# Patient Record
Sex: Female | Born: 1940 | Race: White | Hispanic: No | Marital: Married | Smoking: Former smoker
Health system: Southern US, Community
[De-identification: ages and names within clinical notes are randomized; demographics above are authoritative.]

## PROBLEM LIST (undated history)

## (undated) DIAGNOSIS — D126 Benign neoplasm of colon, unspecified: Secondary | ICD-10-CM

## (undated) DIAGNOSIS — F502 Bulimia nervosa, unspecified: Secondary | ICD-10-CM

## (undated) DIAGNOSIS — K5790 Diverticulosis of intestine, part unspecified, without perforation or abscess without bleeding: Secondary | ICD-10-CM

## (undated) DIAGNOSIS — G459 Transient cerebral ischemic attack, unspecified: Secondary | ICD-10-CM

## (undated) DIAGNOSIS — I639 Cerebral infarction, unspecified: Secondary | ICD-10-CM

## (undated) DIAGNOSIS — E785 Hyperlipidemia, unspecified: Secondary | ICD-10-CM

## (undated) DIAGNOSIS — K648 Other hemorrhoids: Secondary | ICD-10-CM

## (undated) DIAGNOSIS — I10 Essential (primary) hypertension: Secondary | ICD-10-CM

## (undated) DIAGNOSIS — B029 Zoster without complications: Secondary | ICD-10-CM

## (undated) DIAGNOSIS — G610 Guillain-Barre syndrome: Secondary | ICD-10-CM

## (undated) DIAGNOSIS — F1910 Other psychoactive substance abuse, uncomplicated: Secondary | ICD-10-CM

## (undated) HISTORY — PX: TONSILLECTOMY: SUR1361

## (undated) HISTORY — DX: Guillain-Barre syndrome: G61.0

## (undated) HISTORY — DX: Essential (primary) hypertension: I10

## (undated) HISTORY — DX: Diverticulosis of intestine, part unspecified, without perforation or abscess without bleeding: K57.90

## (undated) HISTORY — DX: Zoster without complications: B02.9

## (undated) HISTORY — DX: Transient cerebral ischemic attack, unspecified: G45.9

## (undated) HISTORY — DX: Hyperlipidemia, unspecified: E78.5

## (undated) HISTORY — PX: COLONOSCOPY: SHX174

## (undated) HISTORY — DX: Other hemorrhoids: K64.8

## (undated) HISTORY — DX: Bulimia nervosa: F50.2

## (undated) HISTORY — DX: Other psychoactive substance abuse, uncomplicated: F19.10

## (undated) HISTORY — DX: Benign neoplasm of colon, unspecified: D12.6

## (undated) HISTORY — DX: Bulimia nervosa, unspecified: F50.20

---

## 1964-07-27 HISTORY — PX: APPENDECTOMY: SHX54

## 1968-07-27 DIAGNOSIS — G610 Guillain-Barre syndrome: Secondary | ICD-10-CM

## 1968-07-27 HISTORY — DX: Guillain-Barre syndrome: G61.0

## 1979-07-28 HISTORY — PX: TUBAL LIGATION: SHX77

## 2001-05-05 ENCOUNTER — Other Ambulatory Visit: Admission: RE | Admit: 2001-05-05 | Discharge: 2001-05-05 | Payer: Self-pay | Admitting: Family Medicine

## 2008-11-07 ENCOUNTER — Ambulatory Visit: Payer: Self-pay | Admitting: Internal Medicine

## 2008-11-13 ENCOUNTER — Encounter: Payer: Self-pay | Admitting: Internal Medicine

## 2008-11-13 ENCOUNTER — Ambulatory Visit: Payer: Self-pay | Admitting: Internal Medicine

## 2008-11-20 ENCOUNTER — Encounter: Payer: Self-pay | Admitting: Internal Medicine

## 2008-11-29 ENCOUNTER — Encounter: Admission: RE | Admit: 2008-11-29 | Discharge: 2008-11-29 | Payer: Self-pay | Admitting: Internal Medicine

## 2011-08-19 ENCOUNTER — Other Ambulatory Visit: Payer: Self-pay | Admitting: Internal Medicine

## 2011-08-19 DIAGNOSIS — Z1231 Encounter for screening mammogram for malignant neoplasm of breast: Secondary | ICD-10-CM

## 2011-09-07 ENCOUNTER — Ambulatory Visit
Admission: RE | Admit: 2011-09-07 | Discharge: 2011-09-07 | Disposition: A | Discharge status: Home / Self Care | Payer: Private Health Insurance - Indemnity | Source: Ambulatory Visit | Attending: Internal Medicine | Admitting: Internal Medicine

## 2011-09-07 DIAGNOSIS — Z1231 Encounter for screening mammogram for malignant neoplasm of breast: Secondary | ICD-10-CM

## 2011-10-07 ENCOUNTER — Encounter: Payer: Self-pay | Admitting: Internal Medicine

## 2012-08-09 ENCOUNTER — Other Ambulatory Visit: Payer: Self-pay | Admitting: Internal Medicine

## 2012-08-09 DIAGNOSIS — Z1231 Encounter for screening mammogram for malignant neoplasm of breast: Secondary | ICD-10-CM

## 2012-09-01 ENCOUNTER — Encounter: Payer: Self-pay | Admitting: Internal Medicine

## 2012-09-07 ENCOUNTER — Ambulatory Visit: Payer: Private Health Insurance - Indemnity

## 2012-09-27 ENCOUNTER — Ambulatory Visit
Admission: RE | Admit: 2012-09-27 | Discharge: 2012-09-27 | Disposition: A | Discharge status: Home / Self Care | Payer: Managed Care, Other (non HMO) | Source: Ambulatory Visit | Attending: Internal Medicine | Admitting: Internal Medicine

## 2012-09-27 DIAGNOSIS — Z1231 Encounter for screening mammogram for malignant neoplasm of breast: Secondary | ICD-10-CM

## 2014-12-10 ENCOUNTER — Other Ambulatory Visit: Payer: Self-pay

## 2014-12-10 DIAGNOSIS — Z1231 Encounter for screening mammogram for malignant neoplasm of breast: Secondary | ICD-10-CM

## 2014-12-25 ENCOUNTER — Telehealth: Payer: Self-pay | Admitting: Internal Medicine

## 2014-12-25 NOTE — Telephone Encounter (Signed)
Left message for patient to call back - due for Recall COLON with Dr. Carlean Purl

## 2015-01-09 ENCOUNTER — Ambulatory Visit
Admission: RE | Admit: 2015-01-09 | Discharge: 2015-01-09 | Disposition: A | Discharge status: Home / Self Care | Payer: BLUE CROSS/BLUE SHIELD | Source: Ambulatory Visit

## 2015-01-09 DIAGNOSIS — Z1231 Encounter for screening mammogram for malignant neoplasm of breast: Secondary | ICD-10-CM

## 2016-04-26 DIAGNOSIS — G459 Transient cerebral ischemic attack, unspecified: Secondary | ICD-10-CM

## 2016-04-26 HISTORY — DX: Transient cerebral ischemic attack, unspecified: G45.9

## 2016-05-11 ENCOUNTER — Observation Stay (HOSPITAL_COMMUNITY): Payer: BLUE CROSS/BLUE SHIELD

## 2016-05-11 ENCOUNTER — Observation Stay (HOSPITAL_COMMUNITY)
Admission: EM | Admit: 2016-05-11 | Discharge: 2016-05-12 | Disposition: A | Discharge status: Home / Self Care | Payer: BLUE CROSS/BLUE SHIELD | Attending: Internal Medicine | Admitting: Internal Medicine

## 2016-05-11 ENCOUNTER — Emergency Department (HOSPITAL_COMMUNITY): Payer: BLUE CROSS/BLUE SHIELD

## 2016-05-11 ENCOUNTER — Encounter (HOSPITAL_COMMUNITY): Payer: Self-pay

## 2016-05-11 DIAGNOSIS — Z7982 Long term (current) use of aspirin: Secondary | ICD-10-CM | POA: Diagnosis not present

## 2016-05-11 DIAGNOSIS — I080 Rheumatic disorders of both mitral and aortic valves: Secondary | ICD-10-CM | POA: Insufficient documentation

## 2016-05-11 DIAGNOSIS — R471 Dysarthria and anarthria: Secondary | ICD-10-CM

## 2016-05-11 DIAGNOSIS — E785 Hyperlipidemia, unspecified: Secondary | ICD-10-CM | POA: Diagnosis not present

## 2016-05-11 DIAGNOSIS — Z87891 Personal history of nicotine dependence: Secondary | ICD-10-CM | POA: Diagnosis not present

## 2016-05-11 DIAGNOSIS — G459 Transient cerebral ischemic attack, unspecified: Principal | ICD-10-CM | POA: Diagnosis present

## 2016-05-11 DIAGNOSIS — Z882 Allergy status to sulfonamides status: Secondary | ICD-10-CM | POA: Diagnosis not present

## 2016-05-11 DIAGNOSIS — R29810 Facial weakness: Secondary | ICD-10-CM

## 2016-05-11 DIAGNOSIS — G451 Carotid artery syndrome (hemispheric): Secondary | ICD-10-CM | POA: Diagnosis not present

## 2016-05-11 LAB — CBC
HEMATOCRIT: 41.4 % (ref 36.0–46.0)
Hemoglobin: 13.8 g/dL (ref 12.0–15.0)
MCH: 29.5 pg (ref 26.0–34.0)
MCHC: 33.3 g/dL (ref 30.0–36.0)
MCV: 88.5 fL (ref 78.0–100.0)
Platelets: 234 10*3/uL (ref 150–400)
RBC: 4.68 MIL/uL (ref 3.87–5.11)
RDW: 14 % (ref 11.5–15.5)
WBC: 9 10*3/uL (ref 4.0–10.5)

## 2016-05-11 LAB — DIFFERENTIAL
BASOS ABS: 0 10*3/uL (ref 0.0–0.1)
BASOS PCT: 0 %
Eosinophils Absolute: 0 10*3/uL (ref 0.0–0.7)
Eosinophils Relative: 0 %
LYMPHS PCT: 14 %
Lymphs Abs: 1.3 10*3/uL (ref 0.7–4.0)
MONOS PCT: 3 %
Monocytes Absolute: 0.3 10*3/uL (ref 0.1–1.0)
NEUTROS ABS: 7.4 10*3/uL (ref 1.7–7.7)
Neutrophils Relative %: 83 %

## 2016-05-11 LAB — COMPREHENSIVE METABOLIC PANEL
ALK PHOS: 53 U/L (ref 38–126)
ALT: 28 U/L (ref 14–54)
AST: 27 U/L (ref 15–41)
Albumin: 4.1 g/dL (ref 3.5–5.0)
Anion gap: 8 (ref 5–15)
BUN: 16 mg/dL (ref 6–20)
CHLORIDE: 109 mmol/L (ref 101–111)
CO2: 25 mmol/L (ref 22–32)
CREATININE: 0.87 mg/dL (ref 0.44–1.00)
Calcium: 10 mg/dL (ref 8.9–10.3)
GFR calc Af Amer: >60 mL/min (ref >60)
GFR calc non Af Amer: >60 mL/min (ref >60)
Glucose, Bld: 102 mg/dL — ABNORMAL HIGH (ref 65–99)
Potassium: 4.2 mmol/L (ref 3.5–5.1)
SODIUM: 142 mmol/L (ref 135–145)
Total Bilirubin: 0.7 mg/dL (ref 0.3–1.2)
Total Protein: 7 g/dL (ref 6.5–8.1)

## 2016-05-11 LAB — URINE MICROSCOPIC-ADD ON
Bacteria, UA: NONE SEEN
RBC / HPF: NONE SEEN RBC/hpf (ref 0–5)
WBC, UA: NONE SEEN WBC/hpf (ref 0–5)

## 2016-05-11 LAB — APTT: APTT: 30 s (ref 24–36)

## 2016-05-11 LAB — RAPID URINE DRUG SCREEN, HOSP PERFORMED
AMPHETAMINES: NOT DETECTED
BARBITURATES: NOT DETECTED
BENZODIAZEPINES: NOT DETECTED
Cocaine: NOT DETECTED
Opiates: NOT DETECTED
TETRAHYDROCANNABINOL: NOT DETECTED

## 2016-05-11 LAB — I-STAT CHEM 8, ED
BUN: 19 mg/dL (ref 6–20)
CHLORIDE: 107 mmol/L (ref 101–111)
CREATININE: 0.8 mg/dL (ref 0.44–1.00)
Calcium, Ion: 1.2 mmol/L (ref 1.15–1.40)
GLUCOSE: 101 mg/dL — AB (ref 65–99)
HEMATOCRIT: 43 % (ref 36.0–46.0)
HEMOGLOBIN: 14.6 g/dL (ref 12.0–15.0)
POTASSIUM: 4.2 mmol/L (ref 3.5–5.1)
Sodium: 143 mmol/L (ref 135–145)
TCO2: 25 mmol/L (ref 0–100)

## 2016-05-11 LAB — URINALYSIS, ROUTINE W REFLEX MICROSCOPIC
BILIRUBIN URINE: NEGATIVE
Glucose, UA: NEGATIVE mg/dL
KETONES UR: NEGATIVE mg/dL
Nitrite: NEGATIVE
PROTEIN: NEGATIVE mg/dL
Specific Gravity, Urine: <1.005 — ABNORMAL LOW (ref 1.005–1.030)
pH: 7 (ref 5.0–8.0)

## 2016-05-11 LAB — I-STAT TROPONIN, ED: Troponin i, poc: 0.01 ng/mL (ref 0.00–0.08)

## 2016-05-11 LAB — PROTIME-INR
INR: 0.87
Prothrombin Time: 11.8 seconds (ref 11.4–15.2)

## 2016-05-11 MED ORDER — STROKE: EARLY STAGES OF RECOVERY BOOK
Freq: Once | Status: DC
  Filled 2016-05-11: qty 1

## 2016-05-11 MED ORDER — SODIUM CHLORIDE 0.9 % IV SOLN
INTRAVENOUS | Status: DC
  Administered 2016-05-11 – 2016-05-12 (×2): via INTRAVENOUS

## 2016-05-11 MED ORDER — ASPIRIN 300 MG RE SUPP: 300.0000 mg | Freq: Every day | RECTAL | Status: DC

## 2016-05-11 MED ORDER — LORAZEPAM 1 MG PO TABS
1.0000 mg | ORAL_TABLET | Freq: Once | ORAL | Status: AC
  Administered 2016-05-11: 1 mg via ORAL
  Filled 2016-05-11: qty 1

## 2016-05-11 MED ORDER — ENOXAPARIN SODIUM 40 MG/0.4ML ~~LOC~~ SOLN
40.0000 mg | SUBCUTANEOUS | Status: DC
  Administered 2016-05-11: 40 mg via SUBCUTANEOUS
  Filled 2016-05-11: qty 0.4

## 2016-05-11 MED ORDER — ASPIRIN 325 MG PO TABS
325.0000 mg | ORAL_TABLET | Freq: Every day | ORAL | Status: DC
  Administered 2016-05-11 – 2016-05-12 (×2): 325 mg via ORAL
  Filled 2016-05-11 (×2): qty 1

## 2016-05-11 NOTE — ED Notes (Signed)
Pt presented with left sided facial droop and aphasia. She is currently back to neuro baseline with a NIH of 0 and  passed swallow study, she is ambulatory, next q2h neuro at 0100. Husband at bedside.

## 2016-05-11 NOTE — ED Notes (Signed)
Hospitalist at bedside at this time 

## 2016-05-11 NOTE — Consult Note (Signed)
Admission H&P    Chief Complaint: New onset slurred speech and facial droop as well as dysphagia.  HPI: Sheila Casey is an 75 y.o. female with no documented medical disorder, presenting with acute onset of left facial droop, slurred speech and difficulty swallowing. Patient woke up with deficits this morning. She was last known well at bedtime last night which was 11:00 PM. She has no previous history of stroke nor TIA. CT scan of the head as well as MRI of the brain showed no acute intracranial abnormality. Patient's symptoms have improved, including since arriving in the ED. She is still having some difficulty with swallowing and noticeable facial droop. NIH stroke score was 4.  LSN: 11:00 PM on 05/10/2016 tPA Given: No: Beyond time under for treatment consideration on arrival in the ED mRankin:  History reviewed. No pertinent past medical history.  Past Surgical History:  Procedure Laterality Date  . APPENDECTOMY      Family History  Problem Relation Age of Onset  . Stroke Neg Hx    Social History:  reports that she has quit smoking. She has never used smokeless tobacco. She reports that she does not drink alcohol or use drugs.  Allergies:  Allergies  Allergen Reactions  . Sulfonamide Derivatives     REACTION: Hyper active    Medications: Preadmission medications were reviewed by me.  ROS: History obtained from the patient  General ROS: negative for - chills, fatigue, fever, night sweats, weight gain or weight loss Psychological ROS: negative for - behavioral disorder, hallucinations, memory difficulties, mood swings or suicidal ideation Ophthalmic ROS: negative for - blurry vision, double vision, eye pain or loss of vision ENT ROS: negative for - epistaxis, nasal discharge, oral lesions, sore throat, tinnitus or vertigo Allergy and Immunology ROS: negative for - hives or itchy/watery eyes Hematological and Lymphatic ROS: negative for - bleeding problems, bruising or  swollen lymph nodes Endocrine ROS: negative for - galactorrhea, hair pattern changes, polydipsia/polyuria or temperature intolerance Respiratory ROS: negative for - cough, hemoptysis, shortness of breath or wheezing Cardiovascular ROS: negative for - chest pain, dyspnea on exertion, edema or irregular heartbeat Gastrointestinal ROS: negative for - abdominal pain, diarrhea, hematemesis, nausea/vomiting or stool incontinence Genito-Urinary ROS: negative for - dysuria, hematuria, incontinence or urinary frequency/urgency Musculoskeletal ROS: negative for - joint swelling or muscular weakness Neurological ROS: as noted in HPI Dermatological ROS: negative for rash and skin lesion changes  Physical Examination: Blood pressure 149/88, pulse 91, temperature 99 F (37.2 C), temperature source Oral, resp. rate 21, height 5' 3"  (1.6 m), weight 63.5 kg (140 lb), SpO2 95 %.  HEENT-  Normocephalic, no lesions, without obvious abnormality.  Normal external eye and conjunctiva.  Normal TM's bilaterally.  Normal auditory canals and external ears. Normal external nose, mucus membranes and septum.  Normal pharynx. Neck supple with no masses, nodes, nodules or enlargement. Cardiovascular - regular rate and rhythm, S1, S2 normal, no murmur, click, rub or gallop Lungs - chest clear, no wheezing, rales, normal symmetric air entry Abdomen - soft, non-tender; bowel sounds normal; no masses,  no organomegaly Extremities - no joint deformities, effusion, or inflammation and no edema  Neurologic Examination: Mental Status: Alert, oriented, thought content appropriate.  Speech was slightly slurred, without evidence of aphasia. Able to follow commands without difficulty. Cranial Nerves: II-Visual fields were normal. III/IV/VI-Pupils were equal and reacted normally to light. Extraocular movements were full and conjugate.    V/VII-mild left lower facial numbness; moderate left lower facial weakness.  VIII-normal. X-mild  dysarthria; symmetrical palatal movement. XI: trapezius strength/neck flexion strength normal bilaterally XII-midline tongue extension with normal strength. Motor: 5/5 bilaterally with normal tone and bulk Sensory: Normal throughout. Deep Tendon Reflexes: 1+ and symmetric. Plantars: Mute bilaterally Cerebellar: Normal finger-to-nose testing. Carotid auscultation: Normal  Results for orders placed or performed during the hospital encounter of 05/11/16 (from the past 48 hour(s))  Urine rapid drug screen (hosp performed)not at [facility]     Status: None   Collection Time: 05/11/16  9:44 AM  Result Value Ref Range   Opiates NONE DETECTED NONE DETECTED   Cocaine NONE DETECTED NONE DETECTED   Benzodiazepines NONE DETECTED NONE DETECTED   Amphetamines NONE DETECTED NONE DETECTED   Tetrahydrocannabinol NONE DETECTED NONE DETECTED   Barbiturates NONE DETECTED NONE DETECTED    Comment:        DRUG SCREEN FOR MEDICAL PURPOSES ONLY.  IF CONFIRMATION IS NEEDED FOR ANY PURPOSE, NOTIFY LAB WITHIN 5 DAYS.        LOWEST DETECTABLE LIMITS FOR URINE DRUG SCREEN Drug Class       Cutoff (ng/mL) Amphetamine      1000 Barbiturate      200 Benzodiazepine   371 Tricyclics       062 Opiates          300 Cocaine          300 THC              50   Urinalysis, Routine w reflex microscopic (not at [facility])     Status: Abnormal   Collection Time: 05/11/16  9:44 AM  Result Value Ref Range   Color, Urine YELLOW YELLOW   APPearance CLEAR CLEAR   Specific Gravity, Urine <1.005 (L) 1.005 - 1.030   pH 7.0 5.0 - 8.0   Glucose, UA NEGATIVE NEGATIVE mg/dL   Hgb urine dipstick TRACE (A) NEGATIVE   Bilirubin Urine NEGATIVE NEGATIVE   Ketones, ur NEGATIVE NEGATIVE mg/dL   Protein, ur NEGATIVE NEGATIVE mg/dL   Nitrite NEGATIVE NEGATIVE   Leukocytes, UA SMALL (A) NEGATIVE  Urine microscopic-add on     Status: Abnormal   Collection Time: 05/11/16  9:44 AM  Result Value Ref Range   Squamous Epithelial / LPF 0-5 (A)  NONE SEEN   WBC, UA NONE SEEN 0 - 5 WBC/hpf   RBC / HPF NONE SEEN 0 - 5 RBC/hpf   Bacteria, UA NONE SEEN NONE SEEN  Protime-INR     Status: None   Collection Time: 05/11/16  9:55 AM  Result Value Ref Range   Prothrombin Time 11.8 11.4 - 15.2 seconds   INR 0.87   APTT     Status: None   Collection Time: 05/11/16  9:55 AM  Result Value Ref Range   aPTT 30 24 - 36 seconds  CBC     Status: None   Collection Time: 05/11/16  9:55 AM  Result Value Ref Range   WBC 9.0 4.0 - 10.5 K/uL   RBC 4.68 3.87 - 5.11 MIL/uL   Hemoglobin 13.8 12.0 - 15.0 g/dL   HCT 41.4 36.0 - 46.0 %   MCV 88.5 78.0 - 100.0 fL   MCH 29.5 26.0 - 34.0 pg   MCHC 33.3 30.0 - 36.0 g/dL   RDW 14.0 11.5 - 15.5 %   Platelets 234 150 - 400 K/uL  Differential     Status: None   Collection Time: 05/11/16  9:55 AM  Result Value Ref Range   Neutrophils  Relative % 83 %   Neutro Abs 7.4 1.7 - 7.7 K/uL   Lymphocytes Relative 14 %   Lymphs Abs 1.3 0.7 - 4.0 K/uL   Monocytes Relative 3 %   Monocytes Absolute 0.3 0.1 - 1.0 K/uL   Eosinophils Relative 0 %   Eosinophils Absolute 0.0 0.0 - 0.7 K/uL   Basophils Relative 0 %   Basophils Absolute 0.0 0.0 - 0.1 K/uL  Comprehensive metabolic panel     Status: Abnormal   Collection Time: 05/11/16  9:55 AM  Result Value Ref Range   Sodium 142 135 - 145 mmol/L   Potassium 4.2 3.5 - 5.1 mmol/L   Chloride 109 101 - 111 mmol/L   CO2 25 22 - 32 mmol/L   Glucose, Bld 102 (H) 65 - 99 mg/dL   BUN 16 6 - 20 mg/dL   Creatinine, Ser 0.87 0.44 - 1.00 mg/dL   Calcium 10.0 8.9 - 10.3 mg/dL   Total Protein 7.0 6.5 - 8.1 g/dL   Albumin 4.1 3.5 - 5.0 g/dL   AST 27 15 - 41 U/L   ALT 28 14 - 54 U/L   Alkaline Phosphatase 53 38 - 126 U/L   Total Bilirubin 0.7 0.3 - 1.2 mg/dL   GFR calc non Af Amer >60 >60 mL/min   GFR calc Af Amer >60 >60 mL/min    Comment: (NOTE) The eGFR has been calculated using the CKD EPI equation. This calculation has not been validated in all clinical  situations. eGFR's persistently <60 mL/min signify possible Chronic Kidney Disease.    Anion gap 8 5 - 15  I-stat troponin, ED (not at [facility], [facility])     Status: None   Collection Time: 05/11/16 10:09 AM  Result Value Ref Range   Troponin i, poc 0.01 0.00 - 0.08 ng/mL   Comment 3            Comment: Due to the release kinetics of cTnI, a negative result within the first hours of the onset of symptoms does not rule out myocardial infarction with certainty. If myocardial infarction is still suspected, repeat the test at appropriate intervals.   I-Stat Chem 8, ED  (not at [facility], [facility])     Status: Abnormal   Collection Time: 05/11/16 10:11 AM  Result Value Ref Range   Sodium 143 135 - 145 mmol/L   Potassium 4.2 3.5 - 5.1 mmol/L   Chloride 107 101 - 111 mmol/L   BUN 19 6 - 20 mg/dL   Creatinine, Ser 0.80 0.44 - 1.00 mg/dL   Glucose, Bld 101 (H) 65 - 99 mg/dL   Calcium, Ion 1.20 1.15 - 1.40 mmol/L   TCO2 25 0 - 100 mmol/L   Hemoglobin 14.6 12.0 - 15.0 g/dL   HCT 43.0 36.0 - 46.0 %   Ct Head Wo Contrast  Result Date: 05/11/2016 CLINICAL DATA:  Difficulty swallowing this morning, unable to speak. Speech is improved but is still slurred. Left facial numbness. EXAM: CT HEAD WITHOUT CONTRAST TECHNIQUE: Contiguous axial images were obtained from the base of the skull through the vertex without intravenous contrast. COMPARISON:  None. FINDINGS: Brain: Linear low density along the left upper cerebellum potentially from accentuated sulcus or remote small infarct. Posterior fossa structures otherwise unremarkable. The brainstem, cerebral peduncles, thalami, and basal ganglia appear unremarkable aside from symmetric calcifications in the globus pallidus nuclei which are likely physiologic. 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 No intracranial hemorrhage, mass lesion, or acute CVA.  Vascular: There is atherosclerotic calcification of the cavernous carotid arteries bilaterally. Skull:  Unremarkable Sinuses/Orbits: Unremarkable Other: No supplemental non-categorized findings. IMPRESSION: 1. No acute intracranial findings to correspond to the patient's recent symptoms. 2. 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 3. Prominent sulcus versus a linear remote infarct of the left upper cerebellum, image 11/2. Electronically Signed   By: Van Clines M.D.   On: 05/11/2016 11:37   Mr Brain Wo Contrast  Result Date: 05/11/2016 CLINICAL DATA:  Left facial droop and difficulty swallowing EXAM: MRI HEAD WITHOUT CONTRAST TECHNIQUE: Multiplanar, multiecho pulse sequences of the brain and surrounding structures were obtained without intravenous contrast. COMPARISON:  Head CT 05/11/2016 FINDINGS: Brain: No acute infarct or intraparenchymal hemorrhage. 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 There is multifocal hyperintense T2-weighted signal within the periventricular and deep white matter, most often seen in the setting of chronic microvascular ischemia. No mass lesion or midline shift. No hydrocephalus or extra-axial fluid collection. Vascular: Major intracranial arterial and venous sinus flow voids are preserved. No evidence of chronic microhemorrhage or amyloid angiopathy. Skull and upper cervical spine: The visualized skull base, calvarium, upper cervical spine and extracranial soft tissues are normal. Sinuses/Orbits: No fluid levels or advanced mucosal thickening. No mastoid effusion. Normal orbits. IMPRESSION: 1. No acute intracranial abnormality. 2. 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 3. 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 Electronically Signed   By: Ulyses Jarred M.D.   On: 05/11/2016 18:06    Assessment: 75 y.o. female with no known risk factors for stroke presenting with probable right subcortical MCA  territory small vessel TIA.  Stroke Risk Factors - none  Plan: 1. HgbA1c, fasting lipid panel 2. MRA  of the brain without contrast 3. Speech consult 4. Echocardiogram 5. Carotid dopplers 6. Prophylactic therapy-Antiplatelet med: Aspirin  7. Risk factor modification 8. Telemetry monitoring  C.R. Nicole Kindred, MD [facility] [phone]  05/11/2016, 8:44 PM

## 2016-05-11 NOTE — ED Notes (Signed)
Pt ambulated to restroom from room, tolerated well. 

## 2016-05-11 NOTE — ED Notes (Signed)
Patient transported to CT 

## 2016-05-11 NOTE — ED Provider Notes (Signed)
Assumed care from Dr. Sherry Ruffing. Patient presented with facial droop, dysphagia, and transient dysarthria. MRI is negative. However, symptoms have persisted. Discussed with neurology. Will admit for TIA workup and neurological monitoring.   Duffy Bruce, MD 05/12/16 [phone]

## 2016-05-11 NOTE — ED Provider Notes (Signed)
[place] DEPT Provider Note   CSN: [id number] Arrival date & time: 05/11/16  [id number]     History   Chief Complaint Chief Complaint  Patient presents with  . Aphasia    HPI Sheila Casey is a 75 y.o. female with no significant past medical history who presents with left-sided facial droop, difficulty speaking, and difficulty swallowing. Patient reports that she went to bed at 11 PM last night and was normal. She woke up and, when looking in the mirror, noticed a facial droop. She has never had a stroke and denies taking any medications. She denies any recent head trauma, denies any fevers, chills, chest pain, shortness of breath, nausea, vomiting, constipation, diarrhea, dysuria. Patient denies any recent falls. Patient denies coordination problems. Patient denies vision troubles.  Last normal was greater than 10 hours before arrival. Patient reports her speech difficulty seems to be improving. Patient denies any history of Bell's palsy or tick exposures.  The history is provided by the patient and medical records. No language interpreter was used.  Neurologic Problem  This is a new problem. The current episode started 6 to 12 hours ago. The problem occurs constantly. The problem has been gradually improving. Pertinent negatives include no chest pain, no abdominal pain, no headaches and no shortness of breath. Nothing aggravates the symptoms. The symptoms are relieved by medications. She has tried nothing for the symptoms. The treatment provided no relief.    History reviewed. No pertinent past medical history.  There are no active problems to display for this patient.   Past Surgical History:  Procedure Laterality Date  . APPENDECTOMY      OB History    No data available       Home Medications    Prior to Admission medications   Not on File    Family History No family history on file.  Social History Social History  Substance Use Topics  . Smoking status:  Former Research scientist (life sciences)  . Smokeless tobacco: Never Used  . Alcohol use No     Allergies   Sulfonamide derivatives   Review of Systems Review of Systems  Constitutional: Negative for chills, fatigue and fever.  HENT: Positive for trouble swallowing. Negative for congestion, ear pain, sore throat and voice change.   Eyes: Negative for pain and visual disturbance.  Respiratory: Negative for cough, chest tightness, shortness of breath and wheezing.   Cardiovascular: Negative for chest pain and palpitations.  Gastrointestinal: Negative for abdominal pain, diarrhea, nausea and vomiting.  Genitourinary: Negative for dysuria, flank pain, frequency and hematuria.  Musculoskeletal: Negative for arthralgias, back pain, neck pain and neck stiffness.  Skin: Negative for color change and rash.  Neurological: Positive for facial asymmetry and speech difficulty. Negative for seizures, syncope and headaches.  Psychiatric/Behavioral: Negative for agitation.  All other systems reviewed and are negative.    Physical Exam Updated Vital Signs BP 181/86 (BP Location: Right Arm)   Pulse 103   Temp 99 F (37.2 C) (Oral)   Resp 21   Ht 5\' 3"  (1.6 m)   Wt 140 lb (63.5 kg)   SpO2 99%   BMI 24.80 kg/m   Physical Exam  Constitutional: She is oriented to person, place, and time. She appears well-developed and well-nourished. No distress.  HENT:  Head: Normocephalic and atraumatic.  Mouth/Throat: Oropharynx is clear and moist. No oropharyngeal exudate.  Eyes: Conjunctivae are normal.  Neck: Normal range of motion. Neck supple.  Cardiovascular: Normal rate and regular rhythm.  No murmur heard. Pulmonary/Chest: Effort normal and breath sounds normal. No stridor. No respiratory distress.  Abdominal: Soft. There is no tenderness.  Musculoskeletal: She exhibits no edema.  Neurological: She is alert and oriented to person, place, and time. She has normal reflexes. She is not disoriented. She displays no tremor  and normal reflexes. A cranial nerve deficit is present. No sensory deficit. She exhibits normal muscle tone. She displays no seizure activity. Coordination and gait normal. GCS eye subscore is 4. GCS verbal subscore is 5. GCS motor subscore is 6.  Left-sided facial droop. Symmetric eyebrow raising. No diplopia or other cranial nerve abnormalities. Symmetric pallet elevation on oral exam.   Mild dysarthria, no a aphasia. Normal gait.  Normal strength, sensation, coordination in the extremities.  Skin: Skin is warm and dry. Capillary refill takes less than 2 seconds.  Psychiatric: She has a normal mood and affect.  Nursing note and vitals reviewed.    ED Treatments / Results  Labs (all labs ordered are listed, but only abnormal results are displayed) Labs Reviewed  COMPREHENSIVE METABOLIC PANEL - Abnormal; Notable for the following:       Result Value   Glucose, Bld 102 (*)    All other components within normal limits  URINALYSIS, ROUTINE W REFLEX MICROSCOPIC (NOT AT [facility]) - Abnormal; Notable for the following:    Specific Gravity, Urine <1.005 (*)    Hgb urine dipstick TRACE (*)    Leukocytes, UA SMALL (*)    All other components within normal limits  URINE MICROSCOPIC-ADD ON - Abnormal; Notable for the following:    Squamous Epithelial / LPF 0-5 (*)    All other components within normal limits  I-STAT CHEM 8, ED - Abnormal; Notable for the following:    Glucose, Bld 101 (*)    All other components within normal limits  PROTIME-INR  APTT  CBC  DIFFERENTIAL  RAPID URINE DRUG SCREEN, HOSP PERFORMED  ETHANOL  I-STAT TROPOININ, ED    EKG  EKG Interpretation  Date/Time:  Monday May 11 2016 09:29:19 EDT Ventricular Rate:  102 PR Interval:    QRS Duration: 76 QT Interval:  334 QTC Calculation: 435 R Axis:   8 Text Interpretation:  Sinus tachycardia Borderline T wave abnormalities No Stemi Confirmed by Sherry Ruffing MD, Tabitha Riggins [phone]) on 05/11/2016 6:31:06 PM        Radiology Ct Head Wo Contrast  Result Date: 05/11/2016 CLINICAL DATA:  Difficulty swallowing this morning, unable to speak. Speech is improved but is still slurred. Left facial numbness. EXAM: CT HEAD WITHOUT CONTRAST TECHNIQUE: Contiguous axial images were obtained from the base of the skull through the vertex without intravenous contrast. COMPARISON:  None. FINDINGS: Brain: Linear low density along the left upper cerebellum potentially from accentuated sulcus or remote small infarct. Posterior fossa structures otherwise unremarkable. The brainstem, cerebral peduncles, thalami, and basal ganglia appear unremarkable aside from symmetric calcifications in the globus pallidus nuclei which are likely physiologic. 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 No intracranial hemorrhage, mass lesion, or acute CVA. Vascular: There is atherosclerotic calcification of the cavernous carotid arteries bilaterally. Skull: Unremarkable Sinuses/Orbits: Unremarkable Other: No supplemental non-categorized findings. IMPRESSION: 1. No acute intracranial findings to correspond to the patient's recent symptoms. 2. 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 3. Prominent sulcus versus a linear remote infarct of the left upper cerebellum, image 11/2. Electronically Signed   By: Van Clines M.D.   On: 05/11/2016 11:37   Mr Brain Lottie Dawson  Contrast  Result Date: 05/11/2016 CLINICAL DATA:  Left facial droop and difficulty swallowing EXAM: MRI HEAD WITHOUT CONTRAST TECHNIQUE: Multiplanar, multiecho pulse sequences of the brain and surrounding structures were obtained without intravenous contrast. COMPARISON:  Head CT 05/11/2016 FINDINGS: Brain: No acute infarct or intraparenchymal hemorrhage. 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 There is multifocal  hyperintense T2-weighted signal within the periventricular and deep white matter, most often seen in the setting of chronic microvascular ischemia. No mass lesion or midline shift. No hydrocephalus or extra-axial fluid collection. Vascular: Major intracranial arterial and venous sinus flow voids are preserved. No evidence of chronic microhemorrhage or amyloid angiopathy. Skull and upper cervical spine: The visualized skull base, calvarium, upper cervical spine and extracranial soft tissues are normal. Sinuses/Orbits: No fluid levels or advanced mucosal thickening. No mastoid effusion. Normal orbits. IMPRESSION: 1. No acute intracranial abnormality. 2. 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 3. 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 Electronically Signed   By: Ulyses Jarred M.D.   On: 05/11/2016 18:06    Procedures Procedures (including critical care time)  Medications Ordered in ED Medications  LORazepam (ATIVAN) tablet 1 mg (1 mg Oral Given 05/11/16 1423)     Initial Impression / Assessment and Plan / ED Course  I have reviewed the triage vital signs and the nursing notes.  Pertinent labs & imaging results that were available during my care of the patient were reviewed by me and considered in my medical decision making (see chart for details).  Clinical Course    Sheila Casey is a 75 y.o. female with no significant past medical history who presents with left-sided facial droop, difficulty speaking, and difficulty swallowing.  History and exam are seen above.  Exam reveals left-sided facial droop with forehead sparing. Normal neurologic exam otherwise with normal strength, sensation, and coordination in extremities. No diplopia. Normal extraocular movement. Lungs clear, abdomen nontender.  As patient is outside therapeutic window, code stroke will not be officially called. All the lab testing and imaging an EKG from stroke order set will be used. Anticipate contacting  neurology after CT imaging completed.  CT imaging showed no acute abnormality And possible remote cerebellar infarct.  Given persistent facial droop and concern for stroke, MRI ordered.  Patient's care transferred to Dr. Ellender Hose while awaiting MRI results. Care transferred in stable condition. Anticipate following up with MRI and speaking with neurology. Given improving symptoms, TIA considered.   Final Clinical Impressions(s) / ED Diagnoses   Final diagnoses:  Facial droop  Dysarthria       Courtney Paris, MD 05/11/16 [phone]

## 2016-05-11 NOTE — ED Notes (Signed)
Call placed to MRI per pt request who states that she is ready and relaxed for MRI

## 2016-05-11 NOTE — H&P (Signed)
History and Physical    Sheila Casey [id number] DOB: Mar 12, 1941 DOA: 05/11/2016   PCP: Sheila Reel, MD Chief Complaint:  Chief Complaint  Patient presents with  . Aphasia    HPI: Sheila Casey is a 75 y.o. female with medical history significant of previously healthy.  Patient presents to the ED this morning with c/o slurred speech, L sided facial droop, difficulty swallowing.  LKW was 11 PM last night, she woke up with the symptoms.  Symptoms have improved throughout the day but are still present.  Per EMS BP was 190/100.  ED Course: MRI brain is surprisingly negative for acute stroke.  Review of Systems: As per HPI otherwise 10 point review of systems negative.    History reviewed. No pertinent past medical history.  Past Surgical History:  Procedure Laterality Date  . APPENDECTOMY       reports that she has quit smoking. She has never used smokeless tobacco. She reports that she does not drink alcohol or use drugs.  Allergies  Allergen Reactions  . Sulfonamide Derivatives     REACTION: Hyper active    Family History  Problem Relation Age of Onset  . Stroke Neg Hx    "Mom is 70 years old and still lives by herself."   Prior to Admission medications   Medication Sig Start Date End Date Taking? Authorizing Provider  aspirin EC 325 MG tablet Take 325 mg by mouth once.   Yes Historical Provider, MD  Calcium-Magnesium 200-100 MG TABS Take 1 tablet by mouth daily.   Yes Historical Provider, MD  cholecalciferol (VITAMIN D) 1000 units tablet Take 1,000 Units by mouth daily.   Yes Historical Provider, MD  Multiple Vitamins-Minerals (MULTIVITAMIN WITH MINERALS) tablet Take 1 tablet by mouth daily.   Yes Historical Provider, MD    Physical Exam: Vitals:   05/11/16 1515 05/11/16 1530 05/11/16 1545 05/11/16 1600  BP: (!) 170/101 151/81 156/91 150/90  Pulse: 110 107 102 107  Resp: 20 22 25 21   Temp:      TempSrc:      SpO2: 94% 98% 97% 100%  Weight:      Height:           Constitutional: NAD, calm, comfortable Eyes: PERRL, lids and conjunctivae normal ENMT: Mucous membranes are moist. Posterior pharynx clear of any exudate or lesions.Normal dentition.  Neck: normal, supple, no masses, no thyromegaly Respiratory: clear to auscultation bilaterally, no wheezing, no crackles. Normal respiratory effort. No accessory muscle use.  Cardiovascular: Regular rate and rhythm, no murmurs / rubs / gallops. No extremity edema. 2+ pedal pulses. No carotid bruits.  Abdomen: no tenderness, no masses palpated. No hepatosplenomegaly. Bowel sounds positive.  Musculoskeletal: no clubbing / cyanosis. No joint deformity upper and lower extremities. Good ROM, no contractures. Normal muscle tone.  Skin: no rashes, lesions, ulcers. No induration Neurologic: L sided facial droop, eyebrow raise is symmetrical, speech slightly slurred. Psychiatric: Normal judgment and insight. Alert and oriented x 3. Normal mood.    Labs on Admission: I have personally reviewed following labs and imaging studies  CBC:  Recent Labs Lab 05/11/16 0955 05/11/16 1011  WBC 9.0  --   NEUTROABS 7.4  --   HGB 13.8 14.6  HCT 41.4 43.0  MCV 88.5  --   PLT 234  --    Basic Metabolic Panel:  Recent Labs Lab 05/11/16 0955 05/11/16 1011  NA 142 143  K 4.2 4.2  CL 109 107  CO2 25  --  GLUCOSE 102* 101*  BUN 16 19  CREATININE 0.87 0.80  CALCIUM 10.0  --    GFR: Estimated Creatinine Clearance: 56.2 mL/min (by C-G formula based on SCr of 0.8 mg/dL). Liver Function Tests:  Recent Labs Lab 05/11/16 0955  AST 27  ALT 28  ALKPHOS 53  BILITOT 0.7  PROT 7.0  ALBUMIN 4.1   No results for input(s): LIPASE, AMYLASE in the last 168 hours. No results for input(s): AMMONIA in the last 168 hours. Coagulation Profile:  Recent Labs Lab 05/11/16 0955  INR 0.87   Cardiac Enzymes: No results for input(s): CKTOTAL, CKMB, CKMBINDEX, TROPONINI in the last 168 hours. BNP (last 3  results) No results for input(s): PROBNP in the last 8760 hours. HbA1C: No results for input(s): HGBA1C in the last 72 hours. CBG: No results for input(s): GLUCAP in the last 168 hours. Lipid Profile: No results for input(s): CHOL, HDL, LDLCALC, TRIG, CHOLHDL, LDLDIRECT in the last 72 hours. Thyroid Function Tests: No results for input(s): TSH, T4TOTAL, FREET4, T3FREE, THYROIDAB in the last 72 hours. Anemia Panel: No results for input(s): VITAMINB12, FOLATE, FERRITIN, TIBC, IRON, RETICCTPCT in the last 72 hours. Urine analysis:    Component Value Date/Time   COLORURINE YELLOW 05/11/2016 [place] 05/11/2016 0944   LABSPEC <1.005 (L) 05/11/2016 0944   PHURINE 7.0 05/11/2016 0944   GLUCOSEU NEGATIVE 05/11/2016 0944   HGBUR TRACE (A) 05/11/2016 0944   BILIRUBINUR NEGATIVE 05/11/2016 [place] 05/11/2016 0944   PROTEINUR NEGATIVE 05/11/2016 0944   NITRITE NEGATIVE 05/11/2016 0944   LEUKOCYTESUR SMALL (A) 05/11/2016 0944   Sepsis Labs: @LABRCNTIP (procalcitonin:4,lacticidven:4) )No results found for this or any previous visit (from the past 240 hour(s)).   Radiological Exams on Admission: Ct Head Wo Contrast  Result Date: 05/11/2016 CLINICAL DATA:  Difficulty swallowing this morning, unable to speak. Speech is improved but is still slurred. Left facial numbness. EXAM: CT HEAD WITHOUT CONTRAST TECHNIQUE: Contiguous axial images were obtained from the base of the skull through the vertex without intravenous contrast. COMPARISON:  None. FINDINGS: Brain: Linear low density along the left upper cerebellum potentially from accentuated sulcus or remote small infarct. Posterior fossa structures otherwise unremarkable. The brainstem, cerebral peduncles, thalami, and basal ganglia appear unremarkable aside from symmetric calcifications in the globus pallidus nuclei which are likely physiologic. 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 No intracranial  hemorrhage, mass lesion, or acute CVA. Vascular: There is atherosclerotic calcification of the cavernous carotid arteries bilaterally. Skull: Unremarkable Sinuses/Orbits: Unremarkable Other: No supplemental non-categorized findings. IMPRESSION: 1. No acute intracranial findings to correspond to the patient's recent symptoms. 2. 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 3. Prominent sulcus versus a linear remote infarct of the left upper cerebellum, image 11/2. Electronically Signed   By: Van Clines M.D.   On: 05/11/2016 11:37   Mr Brain Wo Contrast  Result Date: 05/11/2016 CLINICAL DATA:  Left facial droop and difficulty swallowing EXAM: MRI HEAD WITHOUT CONTRAST TECHNIQUE: Multiplanar, multiecho pulse sequences of the brain and surrounding structures were obtained without intravenous contrast. COMPARISON:  Head CT 05/11/2016 FINDINGS: Brain: No acute infarct or intraparenchymal hemorrhage. 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 There is multifocal hyperintense T2-weighted signal within the periventricular and deep white matter, most often seen in the setting of chronic microvascular ischemia. No mass lesion or midline shift. No hydrocephalus  or extra-axial fluid collection. Vascular: Major intracranial arterial and venous sinus flow voids are preserved. No evidence of chronic microhemorrhage or amyloid angiopathy. Skull and upper cervical spine: The visualized skull base, calvarium, upper cervical spine and extracranial soft tissues are normal. Sinuses/Orbits: No fluid levels or advanced mucosal thickening. No mastoid effusion. Normal orbits. IMPRESSION: 1. No acute intracranial abnormality. 2. 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 3. 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 Electronically Signed   By: Ulyses Jarred M.D.    On: 05/11/2016 18:06    EKG: Independently reviewed.  Assessment/Plan Active Problems:   TIA (transient ischemic attack)   Transient ischemic attack (TIA)    1. TIA / Stroke - 1. Stroke pathway 2. MRA 3. 2d echo 4. Carotid duplex 5. A1C, lipid pnl 6. NPO until SLP can eval for swallow, will put on 75 cc/hr NS to prevent dehydration   DVT prophylaxis: Lovenox Code Status: Full Family Communication: Family at bedside Consults called: EDP called neuro Admission status: Admit to obs   Delilah Mulgrew, [facility] Pager [phone] from 7PM-7AM  If 7AM-7PM, please contact the day physician for the patient [email or web address] Password [email or web address]  05/11/2016, 7:52 PM

## 2016-05-11 NOTE — ED Triage Notes (Signed)
Pt. Last seen normal was last ight around 11 when she went to bed. When she woke up this AM she noticed a L sided facial droop, trouble swallowing, and some slurred speech. Pt. Is hypertensive with BP on the truck of 190/100. Pt. Is A/Ox4 with a noticeable L sided facial droop but sts that the swallowing has gotten better.

## 2016-05-12 ENCOUNTER — Observation Stay (HOSPITAL_BASED_OUTPATIENT_CLINIC_OR_DEPARTMENT_OTHER): Payer: BLUE CROSS/BLUE SHIELD

## 2016-05-12 ENCOUNTER — Encounter (HOSPITAL_COMMUNITY): Payer: Self-pay | Admitting: *Deleted

## 2016-05-12 ENCOUNTER — Encounter (HOSPITAL_COMMUNITY): Payer: BLUE CROSS/BLUE SHIELD

## 2016-05-12 DIAGNOSIS — G450 Vertebro-basilar artery syndrome: Secondary | ICD-10-CM | POA: Diagnosis not present

## 2016-05-12 DIAGNOSIS — G459 Transient cerebral ischemic attack, unspecified: Secondary | ICD-10-CM | POA: Diagnosis not present

## 2016-05-12 DIAGNOSIS — I6789 Other cerebrovascular disease: Secondary | ICD-10-CM | POA: Diagnosis not present

## 2016-05-12 LAB — VAS US CAROTID
LCCAPDIAS: 22 cm/s
LEFT ECA DIAS: -15 cm/s
LEFT VERTEBRAL DIAS: 14 cm/s
LICAPDIAS: 18 cm/s
Left CCA dist dias: -22 cm/s
Left CCA dist sys: -107 cm/s
Left CCA prox sys: 112 cm/s
Left ICA dist dias: -28 cm/s
Left ICA dist sys: -100 cm/s
Left ICA prox sys: 76 cm/s
RCCADSYS: -99 cm/s
RCCAPDIAS: 11 cm/s
RCCAPSYS: 65 cm/s
RIGHT ECA DIAS: -14 cm/s
RIGHT VERTEBRAL DIAS: 10 cm/s

## 2016-05-12 LAB — LIPID PANEL
CHOL/HDL RATIO: 4.8 ratio
CHOLESTEROL: 225 mg/dL — AB (ref 0–200)
HDL: 47 mg/dL (ref >40)
LDL Cholesterol: 148 mg/dL — ABNORMAL HIGH (ref 0–99)
Triglycerides: 148 mg/dL (ref <150)
VLDL: 30 mg/dL (ref 0–40)

## 2016-05-12 LAB — ECHOCARDIOGRAM COMPLETE
Height: 63 in
WEIGHTICAEL: 2545.6 [oz_av]

## 2016-05-12 MED ORDER — ATORVASTATIN CALCIUM 10 MG PO TABS: 20.0000 mg | ORAL_TABLET | Freq: Every day | ORAL | Status: DC

## 2016-05-12 MED ORDER — ASPIRIN 325 MG PO TABS: 325.0000 mg | ORAL_TABLET | Freq: Every day | ORAL | 0 refills | Status: DC

## 2016-05-12 MED ORDER — ATORVASTATIN CALCIUM 20 MG PO TABS: 20.0000 mg | ORAL_TABLET | Freq: Every day | ORAL | 0 refills | Status: DC

## 2016-05-12 MED ORDER — ATORVASTATIN CALCIUM 40 MG PO TABS: 40.0000 mg | ORAL_TABLET | Freq: Every day | ORAL | Status: DC

## 2016-05-12 NOTE — Evaluation (Signed)
Speech Language Pathology Evaluation Patient Details Name: Sheila Casey MRN: [id number] DOB: [birth year] Today's Date: 05/12/2016 Time: [id number] SLP Time Calculation (min) (ACUTE ONLY): 12 min  Problem List:  Patient Active Problem List   Diagnosis Date Noted  . TIA (transient ischemic attack) 05/11/2016  . Transient ischemic attack (TIA) 05/11/2016   Past Medical History: History reviewed. No pertinent past medical history. Past Surgical History:  Past Surgical History:  Procedure Laterality Date  . APPENDECTOMY     HPI:  75 y.o.femalewith no documented medical disorder, presenting with acute onset of left facial droop, slurred speech and difficulty swallowing. MRI brain is negative for acute stroke.   Assessment / Plan / Recommendation Clinical Impression  Pt and pt's husband report improved speech and language since admission status. She reports her speech is not quite back to her baseline. SLP notes mildly reduced intelligibility at the conversation level. Pt has reduced sensation and strength on the left side contributing to her impaired speech output. Recommend additional speech and language therapy acutely and for pt to f/u with outpatient SLP if symptoms do not continue to resolve.    SLP Assessment  Patient needs continued Speech Lanaguage Pathology Services    Follow Up Recommendations  Outpatient SLP    Frequency and Duration min 2x/week  2 weeks      SLP Evaluation Cognition  Overall Cognitive Status: Within Functional Limits for tasks assessed       Comprehension  Auditory Comprehension Overall Auditory Comprehension: Appears within functional limits for tasks assessed Visual Recognition/Discrimination Discrimination: Not tested Reading Comprehension Reading Status: Not tested    Expression Expression Primary Mode of Expression: Verbal Verbal Expression Overall Verbal Expression: Appears within functional limits for tasks assessed Written  Expression Written Expression: Not tested   Oral / Motor  Oral Motor/Sensory Function Overall Oral Motor/Sensory Function: Mild impairment Facial ROM: Reduced left Facial Symmetry: Abnormal symmetry left Facial Strength: Reduced left Facial Sensation: Reduced left Motor Speech Overall Motor Speech: Impaired Respiration: Within functional limits Phonation: Normal Resonance: Within functional limits Articulation: Impaired Level of Impairment: Conversation Intelligibility: Intelligibility reduced (mildly reduced) Conversation: 75-100% accurate Motor Planning: Witnin functional limits Motor Speech Errors: Aware Effective Techniques: Slow rate   GO          Functional Assessment Tool Used: skilled clinical judgment Functional Limitations: Motor speech Swallow Current Status [phone]): At least 1 percent but less than 20 percent impaired, limited or restricted Swallow Goal Status [phone]): At least 1 percent but less than 20 percent impaired, limited or restricted Motor Speech Current Status [phone]): At least 1 percent but less than 20 percent impaired, limited or restricted Motor Speech Goal Status [phone]): At least 1 percent but less than 20 percent impaired, limited or restricted Motor Speech Goal Status [phone]): At least 1 percent but less than 20 percent impaired, limited or restricted         Sheila Casey, Student SLP  Sheila Casey 05/12/2016, 10:01 AM

## 2016-05-12 NOTE — Progress Notes (Signed)
PT Cancellation Note  Patient Details Name: AUDRIELLE REGER MRN: [id number] DOB: 09-04-1940   Cancelled Treatment:    Reason Eval/Treat Not Completed: PT screened, no needs identified, will sign off.   Lakiah Dhingra, Tessie Fass 05/12/2016, 11:47 AM  05/12/2016  Donnella Sham, PT [phone]  (pager)

## 2016-05-12 NOTE — Evaluation (Signed)
Occupational Therapy Evaluation and Discharge Patient Details Name: Sheila Casey MRN: [id number] DOB: 08/10/1940 Today's Date: 05/12/2016    History of Present Illness  CEDRIANNA OPITZ is a 75 y.o. female with medical history significant of previously healthy.  Patient presents to the ED this morning with c/o slurred speech, L sided facial droop, difficulty swallowing   Clinical Impression   This 75 yo female admitted with above presents to acute OT at an Independent level. No further OT needs and I have made PT aware that no PT needs were identified. OT will sign off.    Follow Up Recommendations  No OT follow up    Equipment Recommendations  None recommended by OT       Precautions / Restrictions Precautions Precautions: None Restrictions Weight Bearing Restrictions: No      Mobility Bed Mobility Overal bed mobility: Independent                Transfers Overall transfer level: Independent                    Balance Overall balance assessment: Independent                                          ADL Overall ADL's : Independent                                             Vision  Wears reading glasses          Pertinent Vitals/Pain Pain Assessment: No/denies pain     Hand Dominance Right   Extremity/Trunk Assessment Upper Extremity Assessment Upper Extremity Assessment: Overall WFL for tasks assessed   Lower Extremity Assessment Lower Extremity Assessment: Overall WFL for tasks assessed       Communication Communication Communication: No difficulties   Cognition Arousal/Alertness: Awake/alert Behavior During Therapy: WFL for tasks assessed/performed Overall Cognitive Status: Within Functional Limits for tasks assessed                                Home Living Family/patient expects to be discharged to:: Private residence Living Arrangements: Spouse/significant other Available  Help at Discharge: Family Type of Home: House Home Access: Stairs to enter Technical brewer of Steps: 2   Home Layout: One level               Home Equipment: None          Prior Functioning/Environment Level of Independence: Independent                  End of Session Equipment Utilized During Treatment:  (none) Nurse Communication: Mobility status (signing pt off to be independent)  Activity Tolerance: Patient tolerated treatment well Patient left: in chair;with family/visitor present   Time: [id number] OT Time Calculation (min): 10 min Charges:  OT General Charges $OT Visit: 1 Procedure OT Evaluation $OT Eval Low Complexity: 1 Procedure G-Codes: OT G-codes **NOT FOR INPATIENT CLASS** Functional Assessment Tool Used: Clinical observation Functional Limitation: Self care Self Care Current Status [id number]): 0 percent impaired, limited or restricted Self Care Goal Status [id number]): 0 percent impaired, limited or restricted Self Care Discharge Status [id number]): 0 percent impaired, limited or  restricted  Almon Register [id number] 05/12/2016, 11:33 AM

## 2016-05-12 NOTE — Evaluation (Addendum)
Clinical/Bedside Swallow Evaluation Patient Details  Name: Sheila Casey MRN: [id number] Date of Birth: February 15, 1941  Today's Date: 05/12/2016 Time: SLP Start Time (ACUTE ONLY): 0902 SLP Stop Time (ACUTE ONLY): 0910 SLP Time Calculation (min) (ACUTE ONLY): 8 min  Past Medical History: History reviewed. No pertinent past medical history. Past Surgical History:  Past Surgical History:  Procedure Laterality Date  . APPENDECTOMY     HPI:  75 y.o.femalewith no documented medical disorder, presenting with acute onset of left facial droop, slurred speech and difficulty swallowing. MRI brain is negative for acute stroke.   Assessment / Plan / Recommendation Clinical Impression  Pt present with strong vocal quality and cough. She has mildly reduced facial sensation and strength on the left side. She consumed trials of thin liquid and had mild anterior loss consistent on the left side. Pt had continued mild difficulty with reduced movement and sensation with pureed and regular solids. Pt c/o feeling as if she were biting her lip on the left side. SLP observed pt have intermittent immediate and delayed coughing following trials of regular solids. Pt reports this as her baseline, which may be due to a h/o GERD. Given pt's reduced sensation and bedside performance, recommend Dys 3 diet and thin liquids. Will f/u for diet advancement as pt continues to improve.     Aspiration Risk  Mild aspiration risk    Diet Recommendation Dysphagia 3 (Mech soft);Thin liquid   Liquid Administration via: Cup;Straw Medication Administration: Whole meds with liquid Supervision: Intermittent supervision to cue for compensatory strategies;Patient able to self feed Compensations: Minimize environmental distractions;Slow rate;Small sips/bites;Other (Comment) (use straw to prevent anterior spillage) Postural Changes: Seated upright at 90 degrees;Remain upright for at least 30 minutes after po intake    Other   Recommendations Oral Care Recommendations: Oral care BID   Follow up Recommendations Outpatient SLP      Frequency and Duration min 2x/week  2 weeks       Prognosis Prognosis for Safe Diet Advancement: Good      Swallow Study   General HPI: 75 y.o.femalewith no documented medical disorder, presenting with acute onset of left facial droop, slurred speech and difficulty swallowing. MRI brain is negative for acute stroke. Type of Study: Bedside Swallow Evaluation Diet Prior to this Study: NPO Temperature Spikes Noted: No Respiratory Status: Room air History of Recent Intubation: No Behavior/Cognition: Alert;Cooperative;Pleasant mood Oral Cavity Assessment: Within Functional Limits Oral Care Completed by SLP: No Oral Cavity - Dentition: Adequate natural dentition Vision: Functional for self-feeding Self-Feeding Abilities: Able to feed self Patient Positioning: Upright in bed Baseline Vocal Quality: Normal Volitional Cough: Strong Volitional Swallow: Able to elicit    Oral/Motor/Sensory Function Overall Oral Motor/Sensory Function: Mild impairment Facial ROM: Reduced left Facial Symmetry: Abnormal symmetry left Facial Strength: Reduced left Facial Sensation: Reduced left   Ice Chips Ice chips: Not tested   Thin Liquid Thin Liquid: Impaired Presentation: Straw;Cup Oral Phase Impairments: Reduced labial seal Oral Phase Functional Implications: Left anterior spillage    Nectar Thick Nectar Thick Liquid: Not tested   Honey Thick Honey Thick Liquid: Not tested   Puree Puree: Impaired Presentation: Spoon Oral Phase Functional Implications: Left anterior spillage   Solid   GO   Solid: Impaired Presentation: Self Fed Pharyngeal Phase Impairments: Cough - Immediate;Cough - Delayed    Functional Assessment Tool Used: skilled clinical judgment Functional Limitations: Swallowing Swallow Current Status [id number]): At least 1 percent but less than 20 percent impaired, limited  or restricted Swallow Goal  Status [phone]): At least 1 percent but less than 20 percent impaired, limited or restricted   Sheila Casey, Student SLP  [name] 05/12/2016,9:53 AM

## 2016-05-12 NOTE — Progress Notes (Signed)
*  PRELIMINARY RESULTS* Vascular Ultrasound Carotid Duplex (Doppler) has been completed.  Preliminary findings: Bilateral: No significant (1-39%) ICA stenosis. Antegrade vertebral flow.   Landry Mellow, RDMS, RVT  05/12/2016, 1:30 PM

## 2016-05-12 NOTE — Care Management Note (Signed)
Case Management Note  Patient Details  Name: Sheila Casey MRN: [id number] Date of Birth: [birth year]  Subjective/Objective:  Pt in with TIA. She is from home with her spouse.                  Action/Plan: Awaiting PT/OT recommendations. CM following for d/c needs.   Expected Discharge Date:                  Expected Discharge Plan:     In-House Referral:     Discharge planning Services     Post Acute Care Choice:    Choice offered to:     DME Arranged:    DME Agency:     HH Arranged:    HH Agency:     Status of Service:  In process, will continue to follow  If discussed at Long Length of Stay Meetings, dates discussed:    Additional Comments:  Pollie Friar, RN 05/12/2016, 12:08 PM

## 2016-05-12 NOTE — Progress Notes (Signed)
STROKE TEAM PROGRESS NOTE   HISTORY OF PRESENT ILLNESS (per record) Sheila Casey is an 75 y.o. female with no documented medical disorder, presenting with acute onset of left facial droop, slurred speech and difficulty swallowing. Patient woke up with deficits this morning. She was last known well at bedtime last night which was 11:00 PM on 05/10/2016 (LKW). She has no previous history of stroke nor TIA. CT scan of the head as well as MRI of the brain showed no acute intracranial abnormality. Patient's symptoms have improved, including since arriving in the ED. She is still having some difficulty with swallowing and noticeable facial droop. NIH stroke score was 4. Patient was not administered IV t-PA secondary to beyond time under for treatment consideration on arrival in the ED. She was admitted for further evaluation and treatment.   SUBJECTIVE (INTERVAL HISTORY) Her husband Sheila Casey is at the bedside.  Overall she feels her condition is stable. She recounted her HPI with Dr. Leonie Man.    OBJECTIVE Temp:  [97.6 F (36.4 C)-98.8 F (37.1 C)] 97.9 F (36.6 C) (10/17 0729) Pulse Rate:  [77-153] 88 (10/17 0729) Cardiac Rhythm: Normal sinus rhythm (10/17 0700) Resp:  [13-25] 16 (10/17 0729) BP: (132-170)/(73-106) 139/77 (10/17 0729) SpO2:  [82 %-100 %] 97 % (10/17 0729) Weight:  [72.2 kg (159 lb 1.6 oz)] 72.2 kg (159 lb 1.6 oz) (10/16 2330)  CBC:  Recent Labs Lab 05/11/16 0955 05/11/16 1011  WBC 9.0  --   NEUTROABS 7.4  --   HGB 13.8 14.6  HCT 41.4 43.0  MCV 88.5  --   PLT 234  --     Basic Metabolic Panel:  Recent Labs Lab 05/11/16 0955 05/11/16 1011  NA 142 143  K 4.2 4.2  CL 109 107  CO2 25  --   GLUCOSE 102* 101*  BUN 16 19  CREATININE 0.87 0.80  CALCIUM 10.0  --     Lipid Panel:    Component Value Date/Time   CHOL 225 (H) 05/12/2016 0439   TRIG 148 05/12/2016 0439   HDL 47 05/12/2016 0439   CHOLHDL 4.8 05/12/2016 0439   VLDL 30 05/12/2016 0439   LDLCALC 148 (H)  05/12/2016 0439   HgbA1c: No results found for: HGBA1C Urine Drug Screen:    Component Value Date/Time   LABOPIA NONE DETECTED 05/11/2016 0944   COCAINSCRNUR NONE DETECTED 05/11/2016 0944   LABBENZ NONE DETECTED 05/11/2016 0944   AMPHETMU NONE DETECTED 05/11/2016 0944   THCU NONE DETECTED 05/11/2016 0944   LABBARB NONE DETECTED 05/11/2016 0944      IMAGING  Ct Head Wo Contrast  Result Date: 05/11/2016 CLINICAL DATA:  Difficulty swallowing this morning, unable to speak. Speech is improved but is still slurred. Left facial numbness. EXAM: CT HEAD WITHOUT CONTRAST TECHNIQUE: Contiguous axial images were obtained from the base of the skull through the vertex without intravenous contrast. COMPARISON:  None. FINDINGS: Brain: Linear low density along the left upper cerebellum potentially from accentuated sulcus or remote small infarct. Posterior fossa structures otherwise unremarkable. The brainstem, cerebral peduncles, thalami, and basal ganglia appear unremarkable aside from symmetric calcifications in the globus pallidus nuclei which are likely physiologic. 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 No intracranial hemorrhage, mass lesion, or acute CVA. Vascular: There is atherosclerotic calcification of the cavernous carotid arteries bilaterally. Skull: Unremarkable Sinuses/Orbits: Unremarkable Other: No supplemental non-categorized findings. IMPRESSION: 1. No acute intracranial findings to correspond to the patient's recent symptoms. 2. Atherosclerotic calcification of the  cavernous carotid arteries bilaterally. 3. Prominent sulcus versus a linear remote infarct of the left upper cerebellum, image 11/2. Electronically Signed   By: Van Clines M.D.   On: 05/11/2016 11:37   Mr Brain Wo Contrast  Result Date: 05/11/2016 CLINICAL DATA:  Left facial droop and difficulty swallowing EXAM: MRI HEAD WITHOUT CONTRAST TECHNIQUE: Multiplanar, multiecho pulse sequences of the brain and  surrounding structures were obtained without intravenous contrast. COMPARISON:  Head CT 05/11/2016 FINDINGS: Brain: No acute infarct or intraparenchymal hemorrhage. 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 There is multifocal hyperintense T2-weighted signal within the periventricular and deep white matter, most often seen in the setting of chronic microvascular ischemia. No mass lesion or midline shift. No hydrocephalus or extra-axial fluid collection. Vascular: Major intracranial arterial and venous sinus flow voids are preserved. No evidence of chronic microhemorrhage or amyloid angiopathy. Skull and upper cervical spine: The visualized skull base, calvarium, upper cervical spine and extracranial soft tissues are normal. Sinuses/Orbits: No fluid levels or advanced mucosal thickening. No mastoid effusion. Normal orbits. IMPRESSION: 1. No acute intracranial abnormality. 2. 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 3. 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 Electronically Signed   By: Ulyses Jarred M.D.   On: 05/11/2016 18:06   Mr Jodene Nam Head/brain [id number] Cm  Result Date: 05/11/2016 CLINICAL DATA:  Follow-up LEFT facial droop, slurred speech. Symptoms improving. EXAM: MRA HEAD WITHOUT CONTRAST TECHNIQUE: Angiographic images of the Circle of Willis were obtained using MRA technique without intravenous contrast. COMPARISON:  MRI head May 11, 2016 at 1720 hours FINDINGS: ANTERIOR CIRCULATION: Normal flow related enhancement of the included cervical, petrous, cavernous and supraclinoid internal carotid arteries. Patent anterior communicating artery. Normal flow related enhancement of the anterior and middle cerebral arteries, including distal segments. No large vessel occlusion, high-grade stenosis, abnormal luminal irregularity, aneurysm. POSTERIOR CIRCULATION: Codominant  vertebral arteries. Basilar artery is patent, with normal flow related enhancement of the main branch vessels. Focal signal void RIGHT P4 origin associated with tortuosity. Normal flow related enhancement of the posterior cerebral arteries. Small bilateral posterior communicating arteries present. No large vessel occlusion, abnormal luminal irregularity, aneurysm. IMPRESSION: Severe stenosis RIGHT P4 origin, though considering tortuosity, this could be artifact. No emergent large vessel occlusion. Electronically Signed   By: Elon Alas M.D.   On: 05/11/2016 21:45       PHYSICAL EXAM Pleasant middle aged Caucasian lady not in distress. . Afebrile. Head is nontraumatic. Neck is supple without bruit.    Cardiac exam no murmur or gallop. Lungs are clear to auscultation. Distal pulses are well felt. Neurological Exam ;  Awake  Alert oriented x 3. Normal speech and language.eye movements full without nystagmus.fundi were not visualized. Vision acuity and fields appear normal. Hearing is normal. Palatal movements are normal. Face symmetric. Tongue midline. Normal strength, tone, reflexes and coordination. Normal sensation. Gait deferred.  ASSESSMENT/PLAN Sheila Casey is a 75 y.o. female with no significant poast medical history  presenting with facial droop, slurred speech and difficulty swallowing. She did not receive IV t-PA due to beyond window for treatment.   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 Yes; Fluid consistency: Thin  No antithrombotic prior to admission per patient (only took the day of admission, not routinely), now on aspirin 325 mg daily. Continue at discharge.  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 Agree with note above.  She presented with transient slurred speech and left facial droop and dysphagia likely due to a brainstem TIA. MRI scan shows no acute infarct. Etiology likely small vessel disease. Recommend she take aspirin daily. Continue ongoing stroke workup. Likely discharge tomorrow after workup is completed. Greater than 50% time during this 25 minute visit was spent on counseling and coordination of care about stroke and TIA risk, prevention and treatment Antony Contras, MD Medical Director [facility] [place] Pager: [phone] 05/12/2016 2:44 PM  To contact Stroke Continuity provider, please refer to [email or web address] After hours, contact General Neurology

## 2016-05-12 NOTE — Progress Notes (Signed)
*  PRELIMINARY RESULTS* Echocardiogram 2D Echocardiogram has been performed.  Leavy Cella 05/12/2016, 2:02 PM

## 2016-05-12 NOTE — Discharge Summary (Signed)
Physician Discharge Summary  Sheila Casey [id number] DOB: 1940/09/14 DOA: 05/11/2016  PCP: Precious Reel, MD  Admit date: 05/11/2016 Discharge date: 05/12/2016  Admitted From: Home  Disposition:  Home   Recommendations for Outpatient Follow-up:  1. Follow up with PCP in 1-2 weeks 2. Please obtain BMP/CBC in one week 3. Monitor BP 4. Follow HbA1c.     Discharge Condition: Stable.  CODE STATUS: Full Code.  Diet recommendation: Heart Healthy   Brief/Interim Summary: Sheila Casey is a 75 y.o. female with medical history significant of previously healthy.  Patient presents to the ED this morning with c/o slurred speech, L sided facial droop, difficulty swallowing.  LKW was 11 PM last night, she woke up with the symptoms.  Symptoms have improved throughout the day but are still present.  Per EMS BP was 190/100.  ED Course: MRI brain is surprisingly negative for acute stroke.  1-TIA; patient presents with slurred speech.resolved  MRI negative.  LDL 148, started low dose lipitor. Patient prefer low dose and she will work on diet.  She will be discharge on aspirin.  SBP in the 130, needs to follow up with PCP for repeat reading.  Carotid doppler no significant ICA stenosis.  ECHO normal EF. No thrombus    Discharge Diagnoses:  Active Problems:   TIA (transient ischemic attack)   Transient ischemic attack (TIA)    Discharge Instructions     Medication List    STOP taking these medications   aspirin EC 325 MG tablet Replaced by:  aspirin 325 MG tablet     TAKE these medications   aspirin 325 MG tablet Take 1 tablet (325 mg total) by mouth daily. Start taking on:  05/13/2016 Replaces:  aspirin EC 325 MG tablet   atorvastatin 20 MG tablet Commonly known as:  LIPITOR Take 1 tablet (20 mg total) by mouth daily at 6 PM.   Calcium-Magnesium 200-100 MG Tabs Take 1 tablet by mouth daily.   cholecalciferol 1000 units tablet Commonly known as:  VITAMIN D Take  1,000 Units by mouth daily.   multivitamin with minerals tablet Take 1 tablet by mouth daily.       Allergies  Allergen Reactions  . Sulfonamide Derivatives     REACTION: Hyper active    Consultations:  Neurology    Procedures/Studies: Ct Head Wo Contrast  Result Date: 05/11/2016 CLINICAL DATA:  Difficulty swallowing this morning, unable to speak. Speech is improved but is still slurred. Left facial numbness. EXAM: CT HEAD WITHOUT CONTRAST TECHNIQUE: Contiguous axial images were obtained from the base of the skull through the vertex without intravenous contrast. COMPARISON:  None. FINDINGS: Brain: Linear low density along the left upper cerebellum potentially from accentuated sulcus or remote small infarct. Posterior fossa structures otherwise unremarkable. The brainstem, cerebral peduncles, thalami, and basal ganglia appear unremarkable aside from symmetric calcifications in the globus pallidus nuclei which are likely physiologic. 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 No intracranial hemorrhage, mass lesion, or acute CVA. Vascular: There is atherosclerotic calcification of the cavernous carotid arteries bilaterally. Skull: Unremarkable Sinuses/Orbits: Unremarkable Other: No supplemental non-categorized findings. IMPRESSION: 1. No acute intracranial findings to correspond to the patient's recent symptoms. 2. 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 3. Prominent sulcus versus a linear remote infarct of the left upper cerebellum, image 11/2. Electronically Signed   By: Van Clines M.D.   On: 05/11/2016 11:37   Mr Brain Wo Contrast  Result Date: 05/11/2016 CLINICAL DATA:  Left facial droop and difficulty swallowing EXAM: MRI HEAD WITHOUT CONTRAST TECHNIQUE: Multiplanar, multiecho pulse sequences of the brain and surrounding structures were obtained without intravenous contrast. COMPARISON:  Head CT 05/11/2016 FINDINGS: Brain: No  acute infarct or intraparenchymal hemorrhage. 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 There is multifocal hyperintense T2-weighted signal within the periventricular and deep white matter, most often seen in the setting of chronic microvascular ischemia. No mass lesion or midline shift. No hydrocephalus or extra-axial fluid collection. Vascular: Major intracranial arterial and venous sinus flow voids are preserved. No evidence of chronic microhemorrhage or amyloid angiopathy. Skull and upper cervical spine: The visualized skull base, calvarium, upper cervical spine and extracranial soft tissues are normal. Sinuses/Orbits: No fluid levels or advanced mucosal thickening. No mastoid effusion. Normal orbits. IMPRESSION: 1. No acute intracranial abnormality. 2. 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 3. 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 Electronically Signed   By: Ulyses Jarred M.D.   On: 05/11/2016 18:06   Mr Jodene Nam Head/brain [id number] Cm  Result Date: 05/11/2016 CLINICAL DATA:  Follow-up LEFT facial droop, slurred speech. Symptoms improving. EXAM: MRA HEAD WITHOUT CONTRAST TECHNIQUE: Angiographic images of the Circle of Willis were obtained using MRA technique without intravenous contrast. COMPARISON:  MRI head May 11, 2016 at 1720 hours FINDINGS: ANTERIOR CIRCULATION: Normal flow related enhancement of the included cervical, petrous, cavernous and supraclinoid internal carotid arteries. Patent anterior communicating artery. Normal flow related enhancement of the anterior and middle cerebral arteries, including distal segments. No large vessel occlusion, high-grade stenosis, abnormal luminal irregularity, aneurysm. POSTERIOR CIRCULATION: Codominant vertebral arteries. Basilar artery is patent, with normal flow related enhancement of the main branch vessels. Focal signal  void RIGHT P4 origin associated with tortuosity. Normal flow related enhancement of the posterior cerebral arteries. Small bilateral posterior communicating arteries present. No large vessel occlusion, abnormal luminal irregularity, aneurysm. IMPRESSION: Severe stenosis RIGHT P4 origin, though considering tortuosity, this could be artifact. No emergent large vessel occlusion. Electronically Signed   By: Elon Alas M.D.   On: 05/11/2016 21:45       Subjective: Feeling better, speech much improved.   Discharge Exam: Vitals:   05/12/16 0527 05/12/16 0729  BP: (!) 141/91 139/77  Pulse:  88  Resp: 18 16  Temp: 97.9 F (36.6 C) 97.9 F (36.6 C)   Vitals:   05/12/16 0140 05/12/16 0341 05/12/16 0527 05/12/16 0729  BP: (!) 148/73 (!) 142/83 (!) 141/91 139/77  Pulse: 83 85  88  Resp: 16 16 18 16   Temp: 97.9 F (36.6 C) 97.9 F (36.6 C) 97.9 F (36.6 C) 97.9 F (36.6 C)  TempSrc: Oral Oral Oral Oral  SpO2: 99% 98% 98% 97%  Weight:      Height:        General: Pt is alert, awake, not in acute distress Cardiovascular: RRR, S1/S2 +, no rubs, no gallops Respiratory: CTA bilaterally, no wheezing, no rhonchi Abdominal: Soft, NT, ND, bowel sounds + Extremities: no edema, no cyanosis    The results of significant diagnostics from this hospitalization (including imaging, microbiology, ancillary and laboratory) are listed below for reference.     Microbiology: No results found for this or any previous visit (from the past 240 hour(s)).   Labs: BNP (last 3 results) No results for input(s): BNP in the last 8760 hours. Basic Metabolic Panel:  Recent Labs Lab 05/11/16 0955 05/11/16 1011  NA 142 143  K 4.2 4.2  CL 109 107  CO2 25  --   GLUCOSE 102* 101*  BUN 16 19  CREATININE 0.87 0.80  CALCIUM 10.0  --    Liver Function Tests:  Recent Labs Lab 05/11/16 0955  AST 27  ALT 28  ALKPHOS 53  BILITOT 0.7  PROT 7.0  ALBUMIN 4.1   No results for input(s): LIPASE,  AMYLASE in the last 168 hours. No results for input(s): AMMONIA in the last 168 hours. CBC:  Recent Labs Lab 05/11/16 0955 05/11/16 1011  WBC 9.0  --   NEUTROABS 7.4  --   HGB 13.8 14.6  HCT 41.4 43.0  MCV 88.5  --   PLT 234  --    Cardiac Enzymes: No results for input(s): CKTOTAL, CKMB, CKMBINDEX, TROPONINI in the last 168 hours. BNP: Invalid input(s): POCBNP CBG: No results for input(s): GLUCAP in the last 168 hours. D-Dimer No results for input(s): DDIMER in the last 72 hours. Hgb A1c No results for input(s): HGBA1C in the last 72 hours. Lipid Profile  Recent Labs  05/12/16 0439  CHOL 225*  HDL 47  LDLCALC 148*  TRIG 148  CHOLHDL 4.8   Thyroid function studies No results for input(s): TSH, T4TOTAL, T3FREE, THYROIDAB in the last 72 hours.  Invalid input(s): FREET3 Anemia work up No results for input(s): VITAMINB12, FOLATE, FERRITIN, TIBC, IRON, RETICCTPCT in the last 72 hours. Urinalysis    Component Value Date/Time   COLORURINE YELLOW 05/11/2016 [place] 05/11/2016 0944   LABSPEC <1.005 (L) 05/11/2016 0944   PHURINE 7.0 05/11/2016 0944   GLUCOSEU NEGATIVE 05/11/2016 0944   HGBUR TRACE (A) 05/11/2016 0944   BILIRUBINUR NEGATIVE 05/11/2016 0944   KETONESUR NEGATIVE 05/11/2016 0944   PROTEINUR NEGATIVE 05/11/2016 0944   NITRITE NEGATIVE 05/11/2016 0944   LEUKOCYTESUR SMALL (A) 05/11/2016 0944   Sepsis Labs Invalid input(s): PROCALCITONIN,  WBC,  LACTICIDVEN Microbiology No results found for this or any previous visit (from the past 240 hour(s)).   Time coordinating discharge: Over 30 minutes  SIGNED:   Elmarie Shiley, MD  [facility] 05/12/2016, 1:54 PM Pager [phone]  If 7PM-7AM, please contact night-coverage [email or web address] Password [email or web address]

## 2016-05-12 NOTE — Progress Notes (Signed)
Discharge instructions, RX's and follow up appts explained and provided to patient and husband verbalized understanding. Patient left floor via wheelchair accompanied by staff. No c/o pain at d/c.  Dennice Tindol, Tivis Ringer, RN

## 2016-05-13 LAB — HEMOGLOBIN A1C
Hgb A1c MFr Bld: 5.6 % (ref 4.8–5.6)
Mean Plasma Glucose: 114 mg/dL

## 2016-08-11 ENCOUNTER — Ambulatory Visit: Payer: Self-pay | Admitting: Neurology

## 2017-01-12 ENCOUNTER — Encounter: Payer: Self-pay | Admitting: Internal Medicine

## 2017-03-09 ENCOUNTER — Ambulatory Visit: Payer: BLUE CROSS/BLUE SHIELD | Admitting: Internal Medicine

## 2019-08-22 ENCOUNTER — Ambulatory Visit: Payer: BLUE CROSS/BLUE SHIELD

## 2019-08-31 ENCOUNTER — Ambulatory Visit: Payer: Medicare Other | Attending: Internal Medicine

## 2019-08-31 DIAGNOSIS — Z23 Encounter for immunization: Secondary | ICD-10-CM | POA: Insufficient documentation

## 2019-08-31 NOTE — Progress Notes (Signed)
   Covid-19 Vaccination Clinic  Name:  Sheila Casey    MRN: [id number] DOB: September 23, 1940  08/31/2019  Sheila Casey was observed post Covid-19 immunization for 15 minutes without incidence. She was provided with Vaccine Information Sheet and instruction to access the V-Safe system.   Sheila Casey was instructed to call 911 with any severe reactions post vaccine: Marland Kitchen Difficulty breathing  . Swelling of your face and throat  . A fast heartbeat  . A bad rash all over your body  . Dizziness and weakness    Immunizations Administered    Name Date Dose VIS Date Route   Pfizer COVID-19 Vaccine 08/31/2019  5:01 PM 0.3 mL 07/07/2019 Intramuscular   Manufacturer: [place]   Lot: [id number]   [place]: [id number]

## 2019-09-12 ENCOUNTER — Ambulatory Visit: Payer: BLUE CROSS/BLUE SHIELD

## 2019-09-26 ENCOUNTER — Ambulatory Visit: Payer: Medicare Other | Attending: Internal Medicine

## 2019-09-26 DIAGNOSIS — Z23 Encounter for immunization: Secondary | ICD-10-CM | POA: Insufficient documentation

## 2019-09-26 NOTE — Progress Notes (Signed)
   Covid-19 Vaccination Clinic  Name:  CAROLINE TWIDDY    MRN: [id number] DOB: 1941/02/19  09/26/2019  Ms. Brar was observed post Covid-19 immunization for 15 minutes without incident. She was provided with Vaccine Information Sheet and instruction to access the V-Safe system.   Ms. Cunico was instructed to call 911 with any severe reactions post vaccine: Marland Kitchen Difficulty breathing  . Swelling of face and throat  . A fast heartbeat  . A bad rash all over body  . Dizziness and weakness   Immunizations Administered    Name Date Dose VIS Date Route   Pfizer COVID-19 Vaccine 09/26/2019  3:55 PM 0.3 mL 07/07/2019 Intramuscular   Manufacturer: [place]   Lot: [id number]   [place]: [id number]

## 2021-01-20 ENCOUNTER — Other Ambulatory Visit: Payer: Self-pay | Admitting: Internal Medicine

## 2021-01-20 DIAGNOSIS — Z1231 Encounter for screening mammogram for malignant neoplasm of breast: Secondary | ICD-10-CM

## 2021-04-01 ENCOUNTER — Ambulatory Visit
Admission: RE | Admit: 2021-04-01 | Discharge: 2021-04-01 | Disposition: A | Discharge status: Home / Self Care | Payer: Medicare Other | Source: Ambulatory Visit | Attending: Internal Medicine | Admitting: Internal Medicine

## 2021-04-01 ENCOUNTER — Other Ambulatory Visit: Payer: Self-pay

## 2021-04-01 DIAGNOSIS — Z1231 Encounter for screening mammogram for malignant neoplasm of breast: Secondary | ICD-10-CM

## 2022-11-06 IMAGING — MG MM DIGITAL SCREENING BILAT W/ TOMO AND CAD
8 series · 8 of 24 positions shown · non-contrast
Comparison: Previous exam(s).

CLINICAL DATA: Screening.

EXAM:
DIGITAL SCREENING BILATERAL MAMMOGRAM WITH TOMOSYNTHESIS AND CAD
TECHNIQUE: Bilateral screening digital craniocaudal and mediolateral oblique
mammograms were obtained. Bilateral screening digital breast
tomosynthesis was performed. The images were evaluated with
computer-aided detection.

[R CC synth-2D]
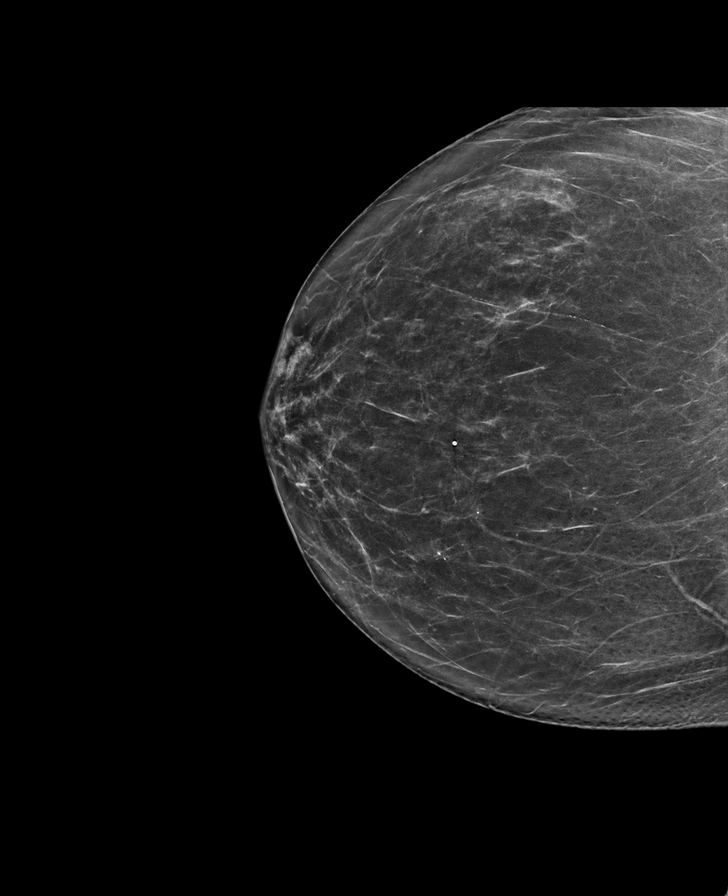

[R MLO synth-2D]
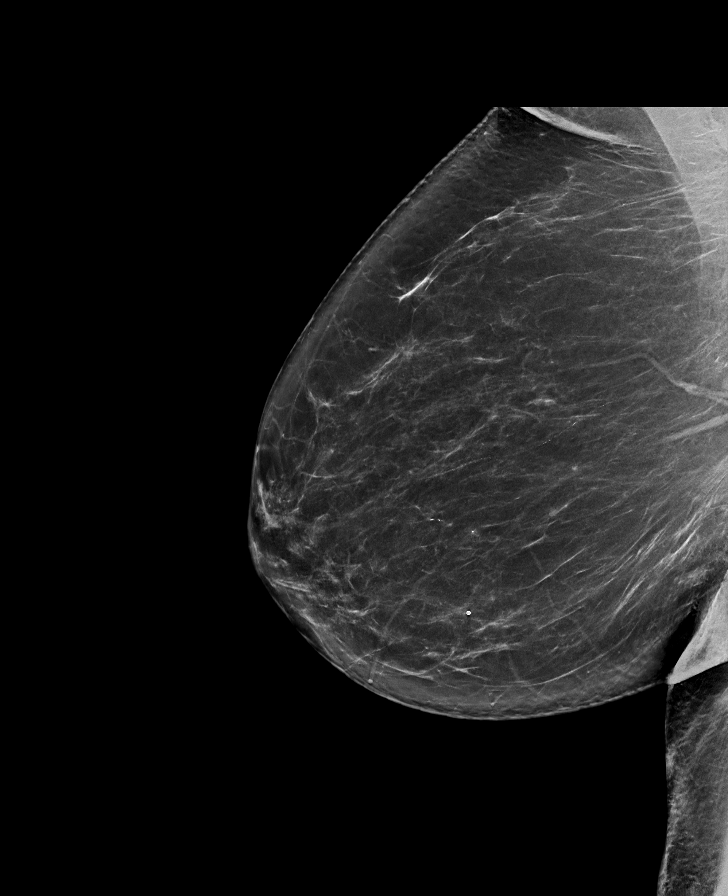

[L MLO synth-2D]
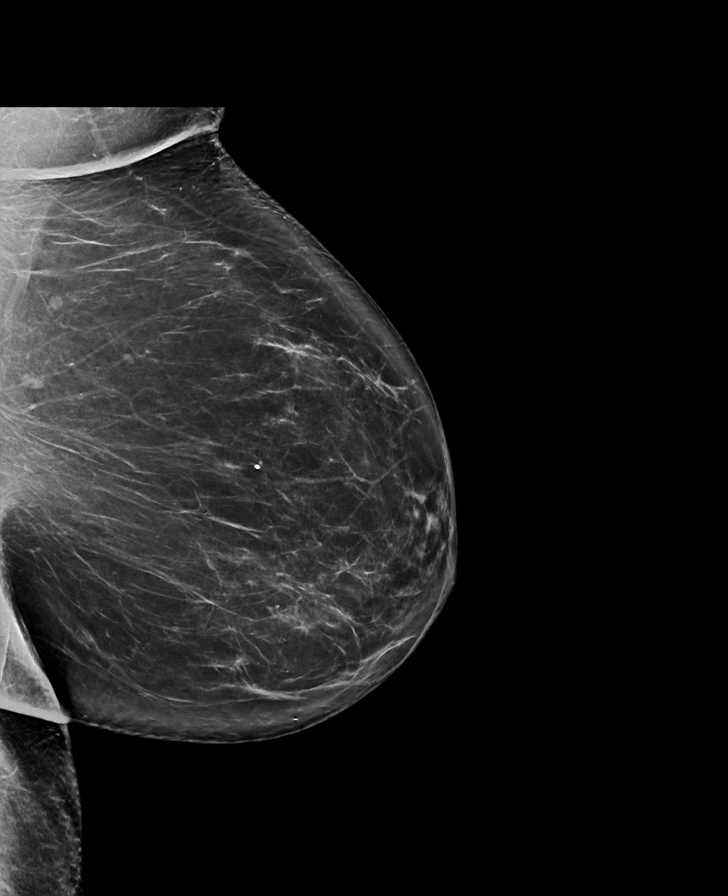

[L CC synth-2D]
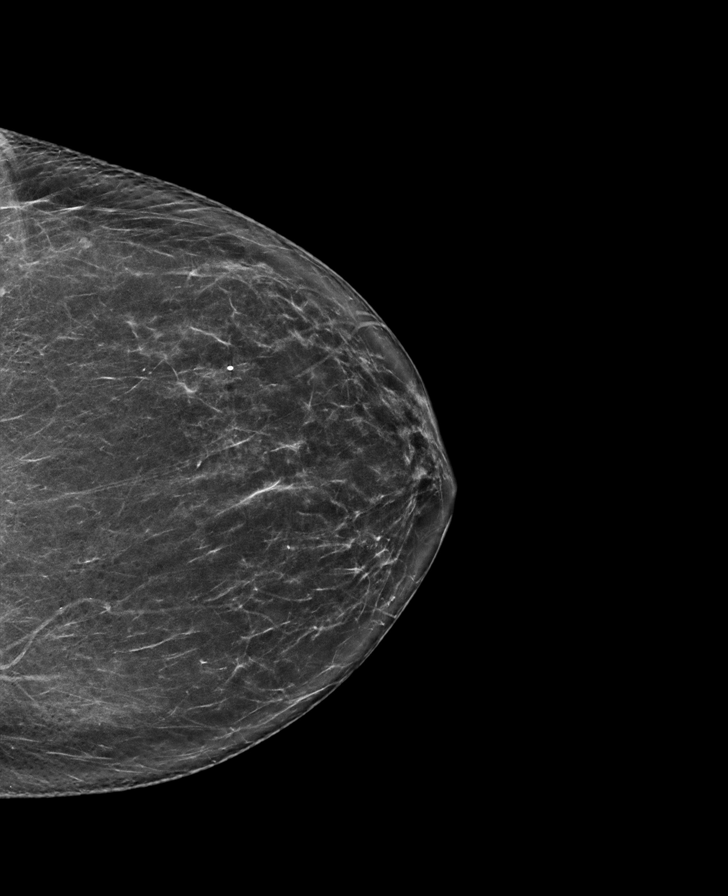

[L MLO tomo · tomo slice 44/87.0]
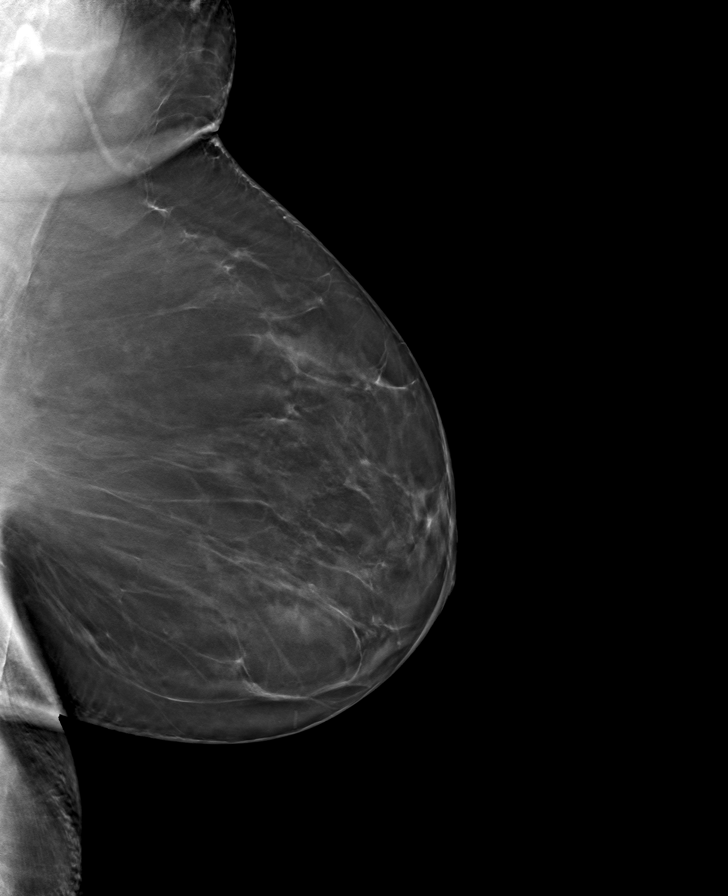

[L CC tomo · tomo slice 37/74.0]
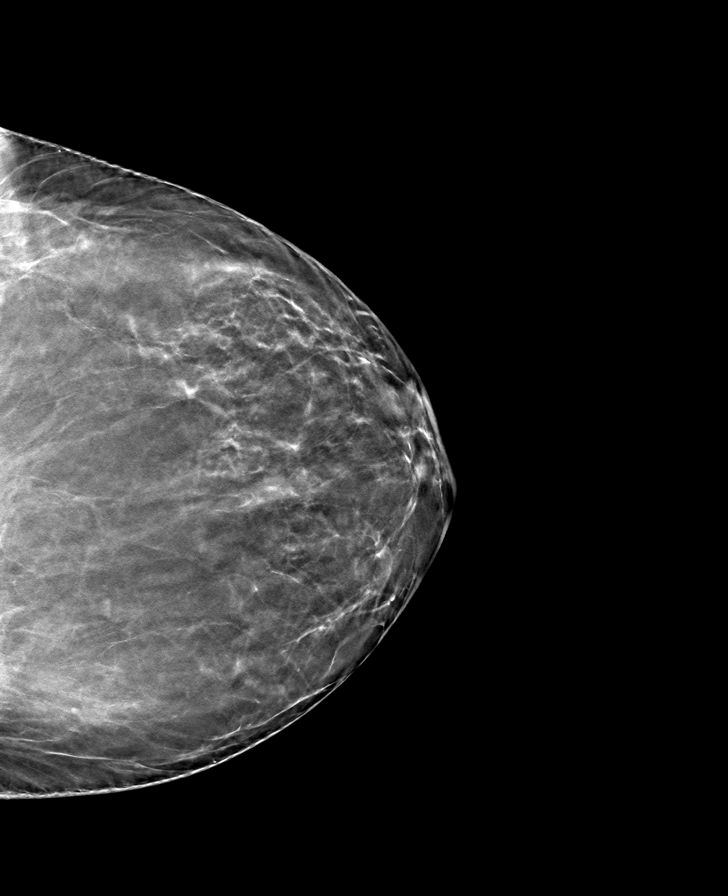

[R CC tomo · tomo slice 36/71.0]
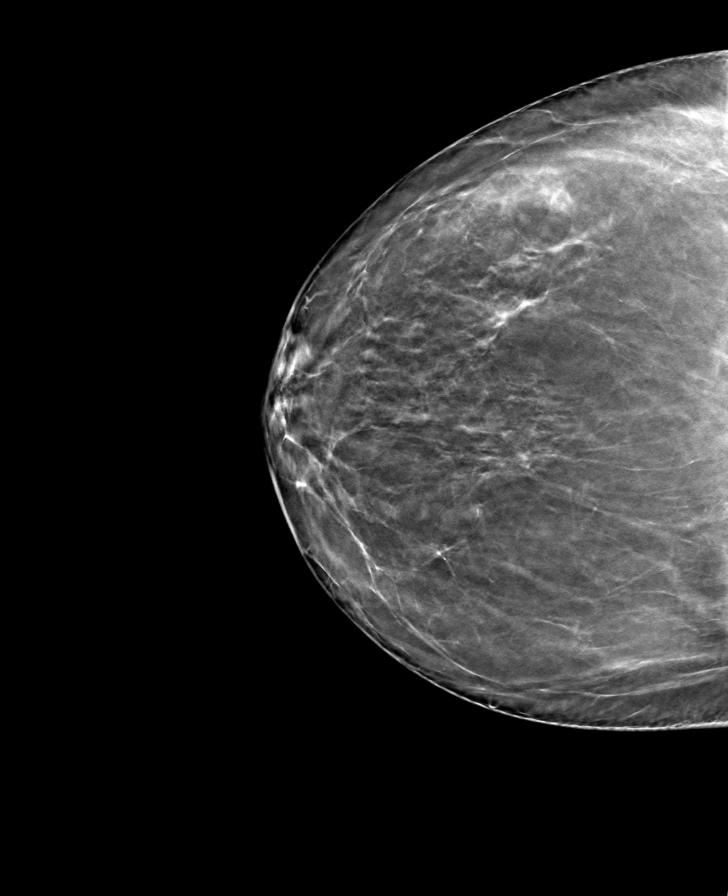

[R MLO tomo · tomo slice 43/85.0]
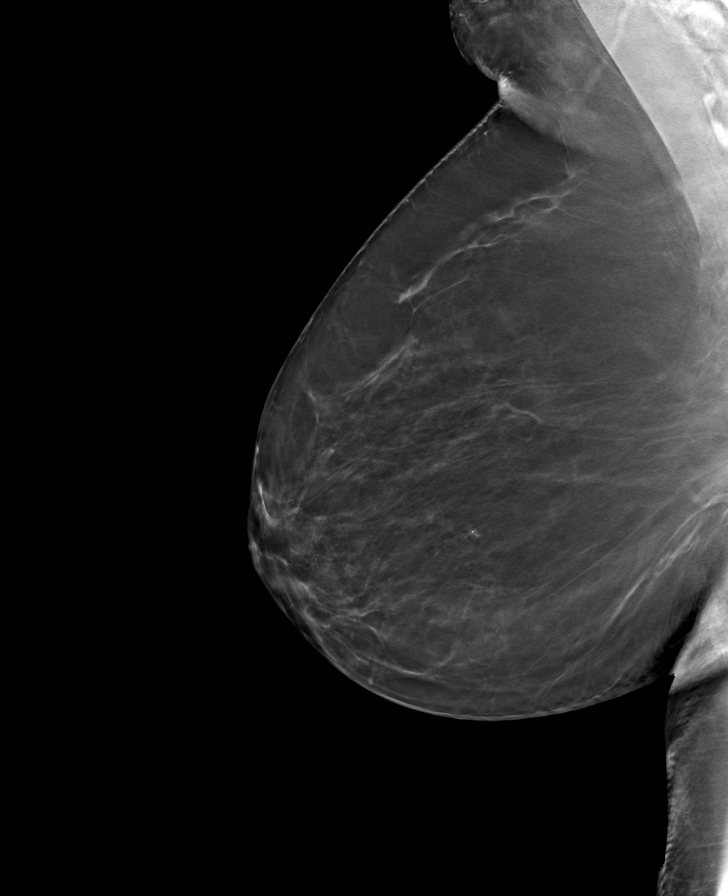

[8 of 24 positions shown; findings below may reference images not displayed]

ACR Breast Density Category b: There are scattered areas of
fibroglandular density.
FINDINGS: There are no findings suspicious for malignancy.
IMPRESSION: No mammographic evidence of malignancy. A result letter of this
screening mammogram will be mailed directly to the patient.

RECOMMENDATION:
Screening mammogram in one year. (Code:51-O-LD2)

BI-RADS CATEGORY  1: Negative.

## 2022-11-10 ENCOUNTER — Other Ambulatory Visit: Payer: Self-pay | Admitting: Adult Health

## 2022-11-10 DIAGNOSIS — M5416 Radiculopathy, lumbar region: Secondary | ICD-10-CM

## 2022-11-10 DIAGNOSIS — M7918 Myalgia, other site: Secondary | ICD-10-CM

## 2022-11-10 DIAGNOSIS — R200 Anesthesia of skin: Secondary | ICD-10-CM

## 2022-11-10 DIAGNOSIS — M79605 Pain in left leg: Secondary | ICD-10-CM

## 2022-11-19 ENCOUNTER — Ambulatory Visit
Admission: RE | Admit: 2022-11-19 | Discharge: 2022-11-19 | Disposition: A | Discharge status: Home / Self Care | Payer: Medicare Other | Source: Ambulatory Visit | Attending: Adult Health | Admitting: Adult Health

## 2022-11-19 DIAGNOSIS — M5416 Radiculopathy, lumbar region: Secondary | ICD-10-CM

## 2022-11-19 DIAGNOSIS — R200 Anesthesia of skin: Secondary | ICD-10-CM

## 2022-11-19 DIAGNOSIS — M79605 Pain in left leg: Secondary | ICD-10-CM

## 2022-11-19 DIAGNOSIS — M7918 Myalgia, other site: Secondary | ICD-10-CM

## 2023-03-11 ENCOUNTER — Other Ambulatory Visit (HOSPITAL_COMMUNITY): Payer: Self-pay | Admitting: Internal Medicine

## 2023-03-11 DIAGNOSIS — I6529 Occlusion and stenosis of unspecified carotid artery: Secondary | ICD-10-CM

## 2023-03-12 ENCOUNTER — Ambulatory Visit (HOSPITAL_COMMUNITY)
Admission: RE | Admit: 2023-03-12 | Discharge: 2023-03-12 | Disposition: A | Discharge status: Home / Self Care | Payer: Medicare Other | Source: Ambulatory Visit | Attending: Surgery | Admitting: Surgery

## 2023-03-12 DIAGNOSIS — I6529 Occlusion and stenosis of unspecified carotid artery: Secondary | ICD-10-CM | POA: Diagnosis not present

## 2023-04-09 ENCOUNTER — Other Ambulatory Visit: Payer: Self-pay | Admitting: Neurological Surgery

## 2023-04-13 NOTE — Pre-Procedure Instructions (Signed)
Surgical Instructions   Your procedure is scheduled on April 16, 2023. Report to [facility] Main Entrance "A" at 8:30 A.M., then check in with the Admitting office. Any questions or running late day of surgery: call [phone]  Questions prior to your surgery date: call [phone], Monday-Friday, 8am-4pm. If you experience any cold or flu symptoms such as cough, fever, chills, shortness of breath, etc. between now and your scheduled surgery, please notify us at the above number.     Remember:  Do not eat or drink after midnight the night before your surgery    Take these medicines the morning of surgery with A SIP OF WATER: amLODipine (NORVASC)    May take these medicines IF NEEDED: acetaminophen (TYLENOL)    Follow your surgeon's instructions on when to stop Aspirin.  If no instructions were given by your surgeon then you will need to call the office to get those instructions.     One week prior to surgery, STOP taking any Aleve, Naproxen, Ibuprofen, Motrin, Advil, Goody's, BC's, all herbal medications, fish oil, and non-prescription vitamins.                     Do NOT Smoke (Tobacco/Vaping) for 24 hours prior to your procedure.  If you use a CPAP at night, you may bring your mask/headgear for your overnight stay.   You will be asked to remove any contacts, glasses, piercing's, hearing aid's, dentures/partials prior to surgery. Please bring cases for these items if needed.    Patients discharged the day of surgery will not be allowed to drive home, and someone needs to stay with them for 24 hours.  SURGICAL WAITING ROOM VISITATION Patients may have no more than 2 support people in the waiting area - these visitors may rotate.   Pre-op nurse will coordinate an appropriate time for 1 ADULT support person, who may not rotate, to accompany patient in pre-op.  Children under the age of 75 must have an adult with them who is not the patient and must remain in the main  waiting area with an adult.  If the patient needs to stay at the hospital during part of their recovery, the visitor guidelines for inpatient rooms apply.  Please refer to the [facility] website for the visitor guidelines for any additional information.   If you received a COVID test during your pre-op visit  it is requested that you wear a mask when out in public, stay away from anyone that may not be feeling well and notify your surgeon if you develop symptoms. If you have been in contact with anyone that has tested positive in the last 10 days please notify you surgeon.      Pre-operative 5 CHG Bathing Instructions   You can play a key role in reducing the risk of infection after surgery. Your skin needs to be as free of germs as possible. You can reduce the number of germs on your skin by washing with CHG (chlorhexidine gluconate) soap before surgery. CHG is an antiseptic soap that kills germs and continues to kill germs even after washing.   DO NOT use if you have an allergy to chlorhexidine/CHG or antibacterial soaps. If your skin becomes reddened or irritated, stop using the CHG and notify one of our RNs at [phone].   Please shower with the CHG soap starting 4 days before surgery using the following schedule:     Please keep in mind the following:  DO NOT  shave, including legs and underarms, starting the day of your first shower.   You may shave your face at any point before/day of surgery.  Place clean sheets on your bed the day you start using CHG soap. Use a clean washcloth (not used since being washed) for each shower. DO NOT sleep with pets once you start using the CHG.   CHG Shower Instructions:  Wash your face and private area with normal soap. If you choose to wash your hair, wash first with your normal shampoo.  After you use shampoo/soap, rinse your hair and body thoroughly to remove shampoo/soap residue.  Turn the water OFF and apply about 3 tablespoons (45 ml)  of CHG soap to a CLEAN washcloth.  Apply CHG soap ONLY FROM YOUR NECK DOWN TO YOUR TOES (washing for 3-5 minutes)  DO NOT use CHG soap on face, private areas, open wounds, or sores.  Pay special attention to the area where your surgery is being performed.  If you are having back surgery, having someone wash your back for you may be helpful. Wait 2 minutes after CHG soap is applied, then you may rinse off the CHG soap.  Pat dry with a clean towel  Put on clean clothes/pajamas   If you choose to wear lotion, please use ONLY the CHG-compatible lotions on the back of this paper.   Additional instructions for the day of surgery: DO NOT APPLY any lotions, deodorants, cologne, or perfumes.   Do not bring valuables to the hospital. [facility] is not responsible for any belongings/valuables. Do not wear nail polish, gel polish, artificial nails, or any other type of covering on natural nails (fingers and toes) Do not wear jewelry or makeup Put on clean/comfortable clothes.  Please brush your teeth.  Ask your nurse before applying any prescription medications to the skin.     CHG Compatible Lotions   Aveeno Moisturizing lotion  Cetaphil Moisturizing Cream  Cetaphil Moisturizing Lotion  Clairol Herbal Essence Moisturizing Lotion, Dry Skin  Clairol Herbal Essence Moisturizing Lotion, Extra Dry Skin  Clairol Herbal Essence Moisturizing Lotion, Normal Skin  Curel Age Defying Therapeutic Moisturizing Lotion with Alpha Hydroxy  Curel Extreme Care Body Lotion  Curel Soothing Hands Moisturizing Hand Lotion  Curel Therapeutic Moisturizing Cream, Fragrance-Free  Curel Therapeutic Moisturizing Lotion, Fragrance-Free  Curel Therapeutic Moisturizing Lotion, Original Formula  Eucerin Daily Replenishing Lotion  Eucerin Dry Skin Therapy Plus Alpha Hydroxy Crme  Eucerin Dry Skin Therapy Plus Alpha Hydroxy Lotion  Eucerin Original Crme  Eucerin Original Lotion  Eucerin Plus Crme Eucerin Plus  Lotion  Eucerin TriLipid Replenishing Lotion  Keri Anti-Bacterial Hand Lotion  Keri Deep Conditioning Original Lotion Dry Skin Formula Softly Scented  Keri Deep Conditioning Original Lotion, Fragrance Free Sensitive Skin Formula  Keri Lotion Fast Absorbing Fragrance Free Sensitive Skin Formula  Keri Lotion Fast Absorbing Softly Scented Dry Skin Formula  Keri Original Lotion  Keri Skin Renewal Lotion Keri Silky Smooth Lotion  Keri Silky Smooth Sensitive Skin Lotion  Nivea Body Creamy Conditioning Oil  Nivea Body Extra Enriched Lotion  Nivea Body Original Lotion  Nivea Body Sheer Moisturizing Lotion Nivea Crme  Nivea Skin Firming Lotion  NutraDerm 30 Skin Lotion  NutraDerm Skin Lotion  NutraDerm Therapeutic Skin Cream  NutraDerm Therapeutic Skin Lotion  ProShield Protective Hand Cream  Provon moisturizing lotion  Please read over the following fact sheets that you were given.

## 2023-04-14 ENCOUNTER — Encounter (HOSPITAL_COMMUNITY)
Admission: RE | Admit: 2023-04-14 | Discharge: 2023-04-14 | Disposition: A | Discharge status: Home / Self Care | Payer: Medicare Other | Source: Ambulatory Visit | Attending: Neurological Surgery | Admitting: Neurological Surgery

## 2023-04-14 ENCOUNTER — Other Ambulatory Visit: Payer: Self-pay

## 2023-04-14 ENCOUNTER — Encounter (HOSPITAL_COMMUNITY): Payer: Self-pay

## 2023-04-14 VITALS — BP 137/76 | HR 97 | Temp 98.3°F | Resp 18 | Ht 63.0 in | Wt 147.5 lb

## 2023-04-14 DIAGNOSIS — Z01818 Encounter for other preprocedural examination: Secondary | ICD-10-CM | POA: Diagnosis present

## 2023-04-14 DIAGNOSIS — Z8673 Personal history of transient ischemic attack (TIA), and cerebral infarction without residual deficits: Secondary | ICD-10-CM | POA: Diagnosis not present

## 2023-04-14 DIAGNOSIS — I10 Essential (primary) hypertension: Secondary | ICD-10-CM | POA: Diagnosis not present

## 2023-04-14 DIAGNOSIS — Z7982 Long term (current) use of aspirin: Secondary | ICD-10-CM | POA: Insufficient documentation

## 2023-04-14 DIAGNOSIS — Z01812 Encounter for preprocedural laboratory examination: Secondary | ICD-10-CM | POA: Diagnosis not present

## 2023-04-14 DIAGNOSIS — G459 Transient cerebral ischemic attack, unspecified: Secondary | ICD-10-CM

## 2023-04-14 DIAGNOSIS — Z01810 Encounter for preprocedural cardiovascular examination: Secondary | ICD-10-CM | POA: Diagnosis not present

## 2023-04-14 DIAGNOSIS — E785 Hyperlipidemia, unspecified: Secondary | ICD-10-CM | POA: Diagnosis not present

## 2023-04-14 HISTORY — DX: Cerebral infarction, unspecified: I63.9

## 2023-04-14 LAB — BASIC METABOLIC PANEL WITH GFR
Anion gap: 7 (ref 5–15)
BUN: 18 mg/dL (ref 8–23)
CO2: 24 mmol/L (ref 22–32)
Calcium: 9.5 mg/dL (ref 8.9–10.3)
Chloride: 104 mmol/L (ref 98–111)
Creatinine, Ser: 0.78 mg/dL (ref 0.44–1.00)
GFR, Estimated: >60 mL/min (ref >60)
Glucose, Bld: 98 mg/dL (ref 70–99)
Potassium: 4 mmol/L (ref 3.5–5.1)
Sodium: 135 mmol/L (ref 135–145)

## 2023-04-14 LAB — CBC
HCT: 38.7 % (ref 36.0–46.0)
Hemoglobin: 12.8 g/dL (ref 12.0–15.0)
MCH: 31.5 pg (ref 26.0–34.0)
MCHC: 33.1 g/dL (ref 30.0–36.0)
MCV: 95.3 fL (ref 80.0–100.0)
Platelets: 236 10*3/uL (ref 150–400)
RBC: 4.06 MIL/uL (ref 3.87–5.11)
RDW: 12.7 % (ref 11.5–15.5)
WBC: 7 10*3/uL (ref 4.0–10.5)
nRBC: 0 % (ref 0.0–0.2)

## 2023-04-14 LAB — PROTIME-INR
INR: 0.9 (ref 0.8–1.2)
Prothrombin Time: 12.3 s (ref 11.4–15.2)

## 2023-04-14 LAB — SURGICAL PCR SCREEN
MRSA, PCR: NEGATIVE
Staphylococcus aureus: NEGATIVE

## 2023-04-14 NOTE — Pre-Procedure Instructions (Signed)
Surgical Instructions   Your procedure is scheduled on April 16, 2023. Report to [facility] Main Entrance "A" at 8:30 A.M., then check in with the Admitting office. Any questions or running late day of surgery: call [phone]  Questions prior to your surgery date: call [phone], Monday-Friday, 8am-4pm. If you experience any cold or flu symptoms such as cough, fever, chills, shortness of breath, etc. between now and your scheduled surgery, please notify us at the above number.     Remember:  Do not eat or drink after midnight the night before your surgery    Take these medicines the morning of surgery with A SIP OF WATER: amLODipine (NORVASC)    May take these medicines IF NEEDED: acetaminophen (TYLENOL)  Tizanidine HCL  Follow your surgeon's instructions on when to stop Aspirin.  If no instructions were given by your surgeon then you will need to call the office to get those instructions.     One week prior to surgery, STOP taking any Aleve, Naproxen, Ibuprofen, Motrin, Advil, Goody's, BC's, all herbal medications, fish oil, and non-prescription vitamins.                     Do NOT Smoke (Tobacco/Vaping) for 24 hours prior to your procedure.  If you use a CPAP at night, you may bring your mask/headgear for your overnight stay.   You will be asked to remove any contacts, glasses, piercing's, hearing aid's, dentures/partials prior to surgery. Please bring cases for these items if needed.    Patients discharged the day of surgery will not be allowed to drive home, and someone needs to stay with them for 24 hours.  SURGICAL WAITING ROOM VISITATION Patients may have no more than 2 support people in the waiting area - these visitors may rotate.   Pre-op nurse will coordinate an appropriate time for 1 ADULT support person, who may not rotate, to accompany patient in pre-op.  Children under the age of 52 must have an adult with them who is not the patient and must remain in  the main waiting area with an adult.  If the patient needs to stay at the hospital during part of their recovery, the visitor guidelines for inpatient rooms apply.  Please refer to the [facility] website for the visitor guidelines for any additional information.   If you received a COVID test during your pre-op visit  it is requested that you wear a mask when out in public, stay away from anyone that may not be feeling well and notify your surgeon if you develop symptoms. If you have been in contact with anyone that has tested positive in the last 10 days please notify you surgeon.      Pre-operative 5 CHG Bathing Instructions   You can play a key role in reducing the risk of infection after surgery. Your skin needs to be as free of germs as possible. You can reduce the number of germs on your skin by washing with CHG (chlorhexidine gluconate) soap before surgery. CHG is an antiseptic soap that kills germs and continues to kill germs even after washing.   DO NOT use if you have an allergy to chlorhexidine/CHG or antibacterial soaps. If your skin becomes reddened or irritated, stop using the CHG and notify one of our RNs at [phone].   Please shower with the CHG soap starting 4 days before surgery using the following schedule:     Please keep in mind the following:  DO  NOT shave, including legs and underarms, starting the day of your first shower.   You may shave your face at any point before/day of surgery.  Place clean sheets on your bed the day you start using CHG soap. Use a clean washcloth (not used since being washed) for each shower. DO NOT sleep with pets once you start using the CHG.   CHG Shower Instructions:  Wash your face and private area with normal soap. If you choose to wash your hair, wash first with your normal shampoo.  After you use shampoo/soap, rinse your hair and body thoroughly to remove shampoo/soap residue.  Turn the water OFF and apply about 3  tablespoons (45 ml) of CHG soap to a CLEAN washcloth.  Apply CHG soap ONLY FROM YOUR NECK DOWN TO YOUR TOES (washing for 3-5 minutes)  DO NOT use CHG soap on face, private areas, open wounds, or sores.  Pay special attention to the area where your surgery is being performed.  If you are having back surgery, having someone wash your back for you may be helpful. Wait 2 minutes after CHG soap is applied, then you may rinse off the CHG soap.  Pat dry with a clean towel  Put on clean clothes/pajamas   If you choose to wear lotion, please use ONLY the CHG-compatible lotions on the back of this paper.   Additional instructions for the day of surgery: DO NOT APPLY any lotions, deodorants, cologne, or perfumes.   Do not bring valuables to the hospital. [facility] is not responsible for any belongings/valuables. Do not wear nail polish, gel polish, artificial nails, or any other type of covering on natural nails (fingers and toes) Do not wear jewelry or makeup Put on clean/comfortable clothes.  Please brush your teeth.  Ask your nurse before applying any prescription medications to the skin.     CHG Compatible Lotions   Aveeno Moisturizing lotion  Cetaphil Moisturizing Cream  Cetaphil Moisturizing Lotion  Clairol Herbal Essence Moisturizing Lotion, Dry Skin  Clairol Herbal Essence Moisturizing Lotion, Extra Dry Skin  Clairol Herbal Essence Moisturizing Lotion, Normal Skin  Curel Age Defying Therapeutic Moisturizing Lotion with Alpha Hydroxy  Curel Extreme Care Body Lotion  Curel Soothing Hands Moisturizing Hand Lotion  Curel Therapeutic Moisturizing Cream, Fragrance-Free  Curel Therapeutic Moisturizing Lotion, Fragrance-Free  Curel Therapeutic Moisturizing Lotion, Original Formula  Eucerin Daily Replenishing Lotion  Eucerin Dry Skin Therapy Plus Alpha Hydroxy Crme  Eucerin Dry Skin Therapy Plus Alpha Hydroxy Lotion  Eucerin Original Crme  Eucerin Original Lotion  Eucerin Plus Crme  Eucerin Plus Lotion  Eucerin TriLipid Replenishing Lotion  Keri Anti-Bacterial Hand Lotion  Keri Deep Conditioning Original Lotion Dry Skin Formula Softly Scented  Keri Deep Conditioning Original Lotion, Fragrance Free Sensitive Skin Formula  Keri Lotion Fast Absorbing Fragrance Free Sensitive Skin Formula  Keri Lotion Fast Absorbing Softly Scented Dry Skin Formula  Keri Original Lotion  Keri Skin Renewal Lotion Keri Silky Smooth Lotion  Keri Silky Smooth Sensitive Skin Lotion  Nivea Body Creamy Conditioning Oil  Nivea Body Extra Enriched Lotion  Nivea Body Original Lotion  Nivea Body Sheer Moisturizing Lotion Nivea Crme  Nivea Skin Firming Lotion  NutraDerm 30 Skin Lotion  NutraDerm Skin Lotion  NutraDerm Therapeutic Skin Cream  NutraDerm Therapeutic Skin Lotion  ProShield Protective Hand Cream  Provon moisturizing lotion  Please read over the following fact sheets that you were given.

## 2023-04-14 NOTE — Progress Notes (Addendum)
PCP - Jonny Ruiz Russo,MD Cardiologist - denies  PPM/ICD - denies Device Orders -  Rep Notified -   Chest x-ray -  EKG - 04/14/23 Stress Test - denies ECHO - 04/29/22 Cardiac Cath - denies  Sleep Study - denies CPAP - no  Fasting Blood Sugar - na Checks Blood Sugar _____ times a day  Last dose of GLP1 agonist-  na GLP1 instructions: na  Blood Thinner Instructions:na Aspirin Instructions:pt reports last dose of aspirin was 9/12 per Dr. Barnett Applebaum instructions.   ERAS Protcol -n/a PRE-SURGERY Ensure or G2-   COVID TEST- no   Anesthesia review: yes- history CVA 04/2022,HTN  Patient denies shortness of breath, fever, cough and chest pain at PAT appointment   All instructions explained to the patient, with a verbal understanding of the material. Patient agrees to go over the instructions while at home for a better understanding. Patient also instructed to wear a mask when out in public prior to surgery . The opportunity to ask questions was provided.

## 2023-04-15 NOTE — Progress Notes (Signed)
Anesthesia Chart Review:  82 yo female with pertinent hx including HTN, HLD, TIA 2017, CVA 2023, remote history of Guillain Barre 1970, remote polysubstance abuse.  The patient was admitted in October 2023 at [facility] for acute CVA.  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  Neurology recommended discharge on aspirin and Plavix for 3 months and then continue aspirin 325 mg alone.  Seen by PCP Dr. Creola Corn on 02/04/2023 and discussed need for back surgery per note, "Min residual. Looks great.  Only on the full dose ASA and now Repatha. there would be some risk of Back surgery but she is prioritzing QOL and we can minimize most risk."  Preop labs reviewed, WNL.  EKG 04/14/2023: Normal sinus rhythm.  Rate 87. Inferior infarct , age undetermined. Anterior infarct , age undetermined  Carotid duplex 03/12/23: Summary:  Right Carotid: Velocities in the right ICA are consistent with a 1-39% stenosis.   Left Carotid: Velocities in the left ICA are consistent with a 1-39% stenosis.   Vertebrals: Bilateral vertebral arteries demonstrate antegrade flow.   TTE 04/30/2022 (Care Everywhere): SUMMARY  The left ventricular size is normal.  Left ventricular systolic function is normal.  LV ejection fraction = 65-70%.  No segmental wall motion abnormalities seen in the left ventricle  The right ventricle is normal in size and function.  There is no significant valvular stenosis or regurgitation.  The aortic sinus is normal size.  IVC size was normal.  There is no pericardial effusion.  There is no comparison study available.     Zannie Cove [facility] Short Stay Center/Anesthesiology Phone [phone] 04/15/2023 9:55 AM

## 2023-04-15 NOTE — Anesthesia Preprocedure Evaluation (Addendum)
Anesthesia Evaluation  Patient identified by MRN, date of birth, ID band Patient awake    Reviewed: Allergy & Precautions, NPO status , Patient's Chart, lab work & pertinent test results  History of Anesthesia Complications Negative for: history of anesthetic complications  Airway Mallampati: I  TM Distance: >3 FB Neck ROM: Full    Dental  (+) Teeth Intact, Dental Advisory Given   Pulmonary neg shortness of breath, neg sleep apnea, neg COPD, neg recent URI, former smoker   breath sounds clear to auscultation       Cardiovascular hypertension, Pt. on medications (-) angina (-) Past MI and (-) CHF (-) dysrhythmias  Rhythm:Regular     Neuro/Psych neg Seizures TIA Neuromuscular disease  negative psych ROS   GI/Hepatic negative GI ROS, Neg liver ROS,,,  Endo/Other  negative endocrine ROS  Lab Results      Component                Value               Date                      HGBA1C                   5.6                 05/12/2016             Renal/GU negative Renal ROSLab Results      Component                Value               Date                      NA                       135                 04/14/2023                K                        4.0                 04/14/2023                CO2                      24                  04/14/2023                GLUCOSE                  98                  04/14/2023                BUN                      18                  04/14/2023                CREATININE  0.78                04/14/2023                CALCIUM                  9.5                 04/14/2023                GFRNONAA                 >60                 04/14/2023                Musculoskeletal negative musculoskeletal ROS (+)    Abdominal   Peds  Hematology negative hematology ROS (+) Lab Results      Component                Value               Date                      WBC                       7.0                 04/14/2023                HGB                      12.8                04/14/2023                HCT                      38.7                04/14/2023                MCV                      95.3                04/14/2023                PLT                      236                 04/14/2023              Anesthesia Other Findings   Reproductive/Obstetrics                             Anesthesia Physical Anesthesia Plan  ASA: 2  Anesthesia Plan: General   Post-op Pain Management: Ofirmev IV (intra-op)*   Induction: Intravenous  PONV Risk Score and Plan: 3 and Ondansetron, Dexamethasone and Treatment may vary due to age or medical condition  Airway Management Planned: Oral ETT  Additional Equipment: None  Intra-op Plan:   Post-operative Plan: Extubation in OR  Informed Consent: I have reviewed the patients History and Physical, chart, labs and discussed the procedure including the risks, benefits  and alternatives for the proposed anesthesia with the patient or authorized representative who has indicated his/her understanding and acceptance.     Dental advisory given  Plan Discussed with: CRNA  Anesthesia Plan Comments: (PAT note by Antionette Poles, PA-C: 82 yo female with pertinent hx including HTN, HLD, TIA 2017, CVA 2023, remote history of Guillain Barre 1970, remote polysubstance abuse.  The patient was admitted in October 2023 at [facility] for acute CVA.  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  Neurology recommended discharge on aspirin and Plavix for 3 months and then continue aspirin 325 mg alone.  Seen by PCP Dr. Creola Corn on 02/04/2023 and discussed need for back surgery per note, "Min residual.Looks great. Only on the  full dose ASA and now Repatha.there would be some risk of Back surgery but she is prioritzing QOL and we can minimize most risk."  Preop labs reviewed, WNL.  EKG 04/14/2023: Normal sinus rhythm.  Rate 87. Inferior infarct , age undetermined. Anterior infarct , age undetermined  Carotid duplex 03/12/23: Summary:  Right Carotid: Velocities in the right ICA are consistent with a 1-39% stenosis.   Left Carotid: Velocities in the left ICA are consistent with a 1-39% stenosis.   Vertebrals: Bilateral vertebral arteries demonstrate antegrade flow.   TTE 04/30/2022 (Care Everywhere): SUMMARY  The left ventricular size is normal.  Left ventricular systolic function is normal.  LV ejection fraction = 65-70%.  No segmental wall motion abnormalities seen in the left ventricle  The right ventricle is normal in size and function.  There is no significant valvular stenosis or regurgitation.  The aortic sinus is normal size.  IVC size was normal.  There is no pericardial effusion.  There is no comparison study available.    )        Anesthesia Quick Evaluation

## 2023-04-16 ENCOUNTER — Ambulatory Visit (HOSPITAL_COMMUNITY): Payer: Medicare Other | Admitting: Physician Assistant

## 2023-04-16 ENCOUNTER — Ambulatory Visit (HOSPITAL_BASED_OUTPATIENT_CLINIC_OR_DEPARTMENT_OTHER): Payer: Medicare Other | Admitting: Anesthesiology

## 2023-04-16 ENCOUNTER — Ambulatory Visit (HOSPITAL_COMMUNITY)
Admission: RE | Disposition: A | Discharge status: Home / Self Care | Payer: Self-pay | Source: Home / Self Care | Attending: Neurological Surgery

## 2023-04-16 ENCOUNTER — Other Ambulatory Visit: Payer: Self-pay

## 2023-04-16 ENCOUNTER — Ambulatory Visit (HOSPITAL_COMMUNITY)
Admission: RE | Admit: 2023-04-16 | Discharge: 2023-04-16 | Disposition: A | Discharge status: Home / Self Care | Payer: Medicare Other | Attending: Neurological Surgery | Admitting: Neurological Surgery

## 2023-04-16 ENCOUNTER — Encounter (HOSPITAL_COMMUNITY): Payer: Self-pay | Admitting: Neurological Surgery

## 2023-04-16 ENCOUNTER — Ambulatory Visit (HOSPITAL_COMMUNITY): Payer: Medicare Other

## 2023-04-16 DIAGNOSIS — I10 Essential (primary) hypertension: Secondary | ICD-10-CM | POA: Diagnosis not present

## 2023-04-16 DIAGNOSIS — M48061 Spinal stenosis, lumbar region without neurogenic claudication: Secondary | ICD-10-CM

## 2023-04-16 DIAGNOSIS — M5116 Intervertebral disc disorders with radiculopathy, lumbar region: Secondary | ICD-10-CM | POA: Insufficient documentation

## 2023-04-16 DIAGNOSIS — Z87891 Personal history of nicotine dependence: Secondary | ICD-10-CM | POA: Diagnosis not present

## 2023-04-16 HISTORY — PX: LUMBAR LAMINECTOMY/DECOMPRESSION MICRODISCECTOMY: SHX5026

## 2023-04-16 SURGERY — LUMBAR LAMINECTOMY/DECOMPRESSION MICRODISCECTOMY 2 LEVELS
Anesthesia: General | Site: Back | Laterality: Left

## 2023-04-16 MED ORDER — DEXAMETHASONE SODIUM PHOSPHATE 10 MG/ML IJ SOLN
INTRAMUSCULAR | Status: AC
  Filled 2023-04-16: qty 1

## 2023-04-16 MED ORDER — ACETAMINOPHEN 10 MG/ML IV SOLN: 1000.0000 mg | Freq: Once | INTRAVENOUS | Status: DC | PRN

## 2023-04-16 MED ORDER — AMISULPRIDE (ANTIEMETIC) 5 MG/2ML IV SOLN
INTRAVENOUS | Status: AC
  Filled 2023-04-16: qty 4

## 2023-04-16 MED ORDER — PHENYLEPHRINE 80 MCG/ML (10ML) SYRINGE FOR IV PUSH (FOR BLOOD PRESSURE SUPPORT)
PREFILLED_SYRINGE | INTRAVENOUS | Status: DC | PRN
  Administered 2023-04-16 (×5): 80 ug via INTRAVENOUS
  Administered 2023-04-16: 160 ug via INTRAVENOUS

## 2023-04-16 MED ORDER — CHLORHEXIDINE GLUCONATE 0.12 % MT SOLN
OROMUCOSAL | Status: AC
  Administered 2023-04-16: 15 mL via OROMUCOSAL
  Filled 2023-04-16: qty 15

## 2023-04-16 MED ORDER — ROCURONIUM BROMIDE 10 MG/ML (PF) SYRINGE
PREFILLED_SYRINGE | INTRAVENOUS | Status: AC
  Filled 2023-04-16: qty 10

## 2023-04-16 MED ORDER — ONDANSETRON HCL 4 MG/2ML IJ SOLN
INTRAMUSCULAR | Status: AC
  Filled 2023-04-16: qty 2

## 2023-04-16 MED ORDER — PHENYLEPHRINE HCL-NACL 20-0.9 MG/250ML-% IV SOLN
INTRAVENOUS | Status: DC | PRN
  Administered 2023-04-16: 10 ug/min via INTRAVENOUS

## 2023-04-16 MED ORDER — BUPIVACAINE HCL (PF) 0.25 % IJ SOLN
INTRAMUSCULAR | Status: DC | PRN
  Administered 2023-04-16: 20 mL
  Administered 2023-04-16: 5 mL

## 2023-04-16 MED ORDER — LABETALOL HCL 5 MG/ML IV SOLN
INTRAVENOUS | Status: DC | PRN
Start: 2023-04-16 — End: 2023-04-16
  Administered 2023-04-16: 7.5 mg via INTRAVENOUS

## 2023-04-16 MED ORDER — FENTANYL CITRATE (PF) 250 MCG/5ML IJ SOLN
INTRAMUSCULAR | Status: DC | PRN
  Administered 2023-04-16: 50 ug via INTRAVENOUS
  Administered 2023-04-16: 100 ug via INTRAVENOUS
  Administered 2023-04-16: 50 ug via INTRAVENOUS

## 2023-04-16 MED ORDER — ORAL CARE MOUTH RINSE: 15.0000 mL | Freq: Once | OROMUCOSAL | Status: AC

## 2023-04-16 MED ORDER — OXYCODONE HCL 5 MG/5ML PO SOLN
ORAL | Status: AC
  Filled 2023-04-16: qty 5

## 2023-04-16 MED ORDER — OXYCODONE HCL 5 MG PO TABS: 5.0000 mg | ORAL_TABLET | Freq: Once | ORAL | Status: AC | PRN

## 2023-04-16 MED ORDER — OXYCODONE HCL 5 MG/5ML PO SOLN
5.0000 mg | Freq: Once | ORAL | Status: AC | PRN
  Administered 2023-04-16: 5 mg via ORAL

## 2023-04-16 MED ORDER — CHLORHEXIDINE GLUCONATE CLOTH 2 % EX PADS: 6.0000 | MEDICATED_PAD | Freq: Once | CUTANEOUS | Status: DC

## 2023-04-16 MED ORDER — MIDAZOLAM HCL 2 MG/2ML IJ SOLN
INTRAMUSCULAR | Status: DC | PRN
  Administered 2023-04-16: 1 mg via INTRAVENOUS

## 2023-04-16 MED ORDER — CEFAZOLIN SODIUM-DEXTROSE 2-4 GM/100ML-% IV SOLN
2.0000 g | INTRAVENOUS | Status: AC
  Administered 2023-04-16: 2 g via INTRAVENOUS

## 2023-04-16 MED ORDER — THROMBIN 5000 UNITS EX SOLR
OROMUCOSAL | Status: DC | PRN
  Administered 2023-04-16: 5 mL via TOPICAL

## 2023-04-16 MED ORDER — PHENYLEPHRINE 80 MCG/ML (10ML) SYRINGE FOR IV PUSH (FOR BLOOD PRESSURE SUPPORT)
PREFILLED_SYRINGE | INTRAVENOUS | Status: AC
  Filled 2023-04-16: qty 20

## 2023-04-16 MED ORDER — PROPOFOL 10 MG/ML IV BOLUS
INTRAVENOUS | Status: AC
  Filled 2023-04-16: qty 20

## 2023-04-16 MED ORDER — AMISULPRIDE (ANTIEMETIC) 5 MG/2ML IV SOLN
10.0000 mg | Freq: Once | INTRAVENOUS | Status: AC
  Administered 2023-04-16: 10 mg via INTRAVENOUS

## 2023-04-16 MED ORDER — ACETAMINOPHEN 500 MG PO TABS
ORAL_TABLET | ORAL | Status: AC
  Filled 2023-04-16: qty 2

## 2023-04-16 MED ORDER — THROMBIN 5000 UNITS EX SOLR
CUTANEOUS | Status: AC
  Filled 2023-04-16: qty 15000

## 2023-04-16 MED ORDER — ACETAMINOPHEN 500 MG PO TABS: 1000.0000 mg | ORAL_TABLET | Freq: Once | ORAL | Status: DC | PRN

## 2023-04-16 MED ORDER — LACTATED RINGERS IV SOLN: INTRAVENOUS | Status: DC

## 2023-04-16 MED ORDER — BUPIVACAINE HCL (PF) 0.25 % IJ SOLN
INTRAMUSCULAR | Status: AC
  Filled 2023-04-16: qty 30

## 2023-04-16 MED ORDER — THROMBIN (RECOMBINANT) 5000 UNITS EX SOLR
CUTANEOUS | Status: DC | PRN
  Administered 2023-04-16: 10 mL via TOPICAL

## 2023-04-16 MED ORDER — CEFAZOLIN SODIUM-DEXTROSE 2-4 GM/100ML-% IV SOLN
INTRAVENOUS | Status: AC
  Filled 2023-04-16: qty 100

## 2023-04-16 MED ORDER — FENTANYL CITRATE (PF) 100 MCG/2ML IJ SOLN: 25.0000 ug | INTRAMUSCULAR | Status: DC | PRN

## 2023-04-16 MED ORDER — ACETAMINOPHEN 160 MG/5ML PO SOLN: 1000.0000 mg | Freq: Once | ORAL | Status: DC | PRN

## 2023-04-16 MED ORDER — CHLORHEXIDINE GLUCONATE 0.12 % MT SOLN: 15.0000 mL | Freq: Once | OROMUCOSAL | Status: AC

## 2023-04-16 MED ORDER — MIDAZOLAM HCL 2 MG/2ML IJ SOLN
INTRAMUSCULAR | Status: AC
  Filled 2023-04-16: qty 2

## 2023-04-16 MED ORDER — 0.9 % SODIUM CHLORIDE (POUR BTL) OPTIME
TOPICAL | Status: DC | PRN
  Administered 2023-04-16: 1000 mL

## 2023-04-16 MED ORDER — DEXAMETHASONE SODIUM PHOSPHATE 10 MG/ML IJ SOLN
INTRAMUSCULAR | Status: DC | PRN
  Administered 2023-04-16: 5 mg via INTRAVENOUS

## 2023-04-16 MED ORDER — ACETAMINOPHEN 500 MG PO TABS: 1000.0000 mg | ORAL_TABLET | ORAL | Status: DC

## 2023-04-16 MED ORDER — PROPOFOL 10 MG/ML IV BOLUS
INTRAVENOUS | Status: DC | PRN
Start: 2023-04-16 — End: 2023-04-16
  Administered 2023-04-16: 80 mg via INTRAVENOUS

## 2023-04-16 MED ORDER — FENTANYL CITRATE (PF) 250 MCG/5ML IJ SOLN
INTRAMUSCULAR | Status: AC
  Filled 2023-04-16: qty 5

## 2023-04-16 MED ORDER — GABAPENTIN 300 MG PO CAPS
ORAL_CAPSULE | ORAL | Status: AC
  Administered 2023-04-16: 300 mg via ORAL
  Filled 2023-04-16: qty 1

## 2023-04-16 MED ORDER — ROCURONIUM BROMIDE 10 MG/ML (PF) SYRINGE
PREFILLED_SYRINGE | INTRAVENOUS | Status: DC | PRN
  Administered 2023-04-16: 60 mg via INTRAVENOUS
  Administered 2023-04-16: 20 mg via INTRAVENOUS

## 2023-04-16 MED ORDER — LIDOCAINE 2% (20 MG/ML) 5 ML SYRINGE
INTRAMUSCULAR | Status: AC
  Filled 2023-04-16: qty 5

## 2023-04-16 MED ORDER — GABAPENTIN 300 MG PO CAPS: 300.0000 mg | ORAL_CAPSULE | ORAL | Status: AC

## 2023-04-16 MED ORDER — ONDANSETRON HCL 4 MG/2ML IJ SOLN
INTRAMUSCULAR | Status: DC | PRN
  Administered 2023-04-16: 4 mg via INTRAVENOUS

## 2023-04-16 SURGICAL SUPPLY — 41 items
APL SKNCLS STERI-STRIP NONHPOA (GAUZE/BANDAGES/DRESSINGS) ×1
BAG COUNTER SPONGE SURGICOUNT (BAG) ×1 IMPLANT
BAG SPNG CNTER NS LX DISP (BAG) ×1
BENZOIN TINCTURE PRP APPL 2/3 (GAUZE/BANDAGES/DRESSINGS) ×1 IMPLANT
BUR CARBIDE MATCH 3.0 (BURR) ×1 IMPLANT
CANISTER SUCT 3000ML PPV (MISCELLANEOUS) ×1 IMPLANT
DRAPE LAPAROTOMY 100X72X124 (DRAPES) ×1 IMPLANT
DRAPE MICROSCOPE SLANT 54X150 (MISCELLANEOUS) ×1 IMPLANT
DRAPE SURG 17X23 STRL (DRAPES) ×1 IMPLANT
DRSG OPSITE POSTOP 4X6 (GAUZE/BANDAGES/DRESSINGS) IMPLANT
DURAPREP 26ML APPLICATOR (WOUND CARE) ×1 IMPLANT
ELECT REM PT RETURN 9FT ADLT (ELECTROSURGICAL) ×1
ELECTRODE REM PT RTRN 9FT ADLT (ELECTROSURGICAL) ×1 IMPLANT
GAUZE 4X4 16PLY ~~LOC~~+RFID DBL (SPONGE) IMPLANT
GLOVE BIO SURGEON STRL SZ7 (GLOVE) IMPLANT
GLOVE BIO SURGEON STRL SZ8 (GLOVE) ×1 IMPLANT
GLOVE BIOGEL PI IND STRL 7.0 (GLOVE) IMPLANT
GOWN STRL REUS W/ TWL LRG LVL3 (GOWN DISPOSABLE) IMPLANT
GOWN STRL REUS W/ TWL XL LVL3 (GOWN DISPOSABLE) ×1 IMPLANT
GOWN STRL REUS W/TWL 2XL LVL3 (GOWN DISPOSABLE) IMPLANT
GOWN STRL REUS W/TWL LRG LVL3 (GOWN DISPOSABLE)
GOWN STRL REUS W/TWL XL LVL3 (GOWN DISPOSABLE) ×1
HEMOSTAT POWDER KIT SURGIFOAM (HEMOSTASIS) ×1 IMPLANT
KIT BASIN OR (CUSTOM PROCEDURE TRAY) ×1 IMPLANT
KIT TURNOVER KIT B (KITS) ×1 IMPLANT
NDL HYPO 25X1 1.5 SAFETY (NEEDLE) ×1 IMPLANT
NDL SPNL 20GX3.5 QUINCKE YW (NEEDLE) IMPLANT
NEEDLE HYPO 25X1 1.5 SAFETY (NEEDLE) ×1 IMPLANT
NEEDLE SPNL 20GX3.5 QUINCKE YW (NEEDLE) IMPLANT
NS IRRIG 1000ML POUR BTL (IV SOLUTION) ×1 IMPLANT
PACK LAMINECTOMY NEURO (CUSTOM PROCEDURE TRAY) ×1 IMPLANT
PAD ARMBOARD 7.5X6 YLW CONV (MISCELLANEOUS) ×3 IMPLANT
SPONGE SURGIFOAM ABS GEL SZ50 (HEMOSTASIS) ×1 IMPLANT
STRIP CLOSURE SKIN 1/2X4 (GAUZE/BANDAGES/DRESSINGS) ×1 IMPLANT
SUT VIC AB 0 CT1 18XCR BRD8 (SUTURE) ×1 IMPLANT
SUT VIC AB 0 CT1 8-18 (SUTURE) ×1
SUT VIC AB 2-0 CP2 18 (SUTURE) ×1 IMPLANT
SUT VIC AB 3-0 SH 8-18 (SUTURE) ×1 IMPLANT
TOWEL GREEN STERILE (TOWEL DISPOSABLE) ×1 IMPLANT
TOWEL GREEN STERILE FF (TOWEL DISPOSABLE) ×1 IMPLANT
WATER STERILE IRR 1000ML POUR (IV SOLUTION) ×1 IMPLANT

## 2023-04-16 NOTE — Transfer of Care (Signed)
Immediate Anesthesia Transfer of Care Note  Patient: Sheila Casey  Procedure(s) Performed: Laminectomy and Foraminotomy - Lumbar Three-Four/Lumbar Four- Lumbar Five- Left, sublaminar decompression (Left: Back)  Patient Location: PACU  Anesthesia Type:General  Level of Consciousness: awake and patient cooperative  Airway & Oxygen Therapy: Patient Spontanous Breathing and Patient connected to nasal cannula oxygen  Post-op Assessment: Report given to RN and Post -op Vital signs reviewed and stable  Post vital signs: Reviewed and stable  Last Vitals:  Vitals Value Taken Time  BP 117/82 04/16/23 1345  Temp 36.2 C 04/16/23 1342  Pulse 75 04/16/23 1350  Resp 16 04/16/23 1350  SpO2 99 % 04/16/23 1350  Vitals shown include unfiled device data.  Last Pain:  Vitals:   04/16/23 0857  TempSrc:   PainSc: 0-No pain         Complications: No notable events documented.

## 2023-04-16 NOTE — Discharge Summary (Signed)
Physician Discharge Summary  Patient ID: Sheila Casey MRN: [id number] DOB/AGE: 80-Mar-1942 80 y.o.  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 The patient tolerated the procedure well and was taken to the recovery room and then to the floor in stable condition. The hospital course was routine. There were no complications. The wound remained clean dry and intact. Pt had appropriate back soreness. No complaints of leg pain or new N/T/W. The patient remained afebrile with stable vital signs, and tolerated a regular diet. The patient continued to increase activities, and pain was well controlled with oral pain medications.   Consults: None  Significant Diagnostic Studies:  Results for orders placed or performed during the hospital encounter of 04/14/23  Surgical pcr screen   Specimen: Nasal Mucosa; Nasal Swab  Result Value Ref Range   MRSA, PCR NEGATIVE NEGATIVE   Staphylococcus aureus NEGATIVE NEGATIVE  Basic metabolic panel per protocol  Result Value Ref Range   Sodium 135 135 - 145 mmol/L   Potassium 4.0 3.5 - 5.1 mmol/L   Chloride 104 98 - 111 mmol/L   CO2 24 22 - 32 mmol/L   Glucose, Bld 98 70 - 99 mg/dL   BUN 18 8 - 23 mg/dL   Creatinine, Ser 4.09 0.44 - 1.00 mg/dL   Calcium 9.5 8.9 - 81.1 mg/dL   GFR, Estimated >91 >47 mL/min   Anion gap 7 5 - 15  CBC per protocol  Result Value Ref Range   WBC 7.0 4.0 - 10.5 K/uL   RBC 4.06 3.87 - 5.11 MIL/uL   Hemoglobin 12.8 12.0 - 15.0 g/dL   HCT 82.9 56.2 - 13.0 %   MCV 95.3 80.0 - 100.0 fL   MCH 31.5 26.0 - 34.0 pg   MCHC 33.1 30.0 - 36.0 g/dL   RDW 86.5 78.4 - 69.6  %   Platelets 236 150 - 400 K/uL   nRBC 0.0 0.0 - 0.2 %  Protime-INR  Result Value Ref Range   Prothrombin Time 12.3 11.4 - 15.2 seconds   INR 0.9 0.8 - 1.2    No results found.  Antibiotics:  Anti-infectives (From admission, onward)    Start     Dose/Rate Route Frequency Ordered Stop   04/16/23 0846  ceFAZolin (ANCEF) 2-4 GM/100ML-% IVPB       Note to Pharmacy: Darrick Huntsman: cabinet override      04/16/23 0846 04/16/23 1137   04/16/23 0845  ceFAZolin (ANCEF) IVPB 2g/100 mL premix        2 g 200 mL/hr over 30 Minutes Intravenous On call to O.R. 04/16/23 0844 04/16/23 1134       Discharge Exam: Blood pressure 138/80, pulse 98, temperature 98.1 F (36.7 C), temperature source Oral, resp. rate 18, height 5\' 3"  (1.6 m), weight 65.3 kg, SpO2 96%. Neurologic: Grossly normal Dressing dry  Discharge Medications:   Allergies as of 04/16/2023       Reactions   Influenza Vaccines Other (See Comments)   Sulfonamide Derivatives Anxiety   hyper active        Medication List     TAKE these medications    acetaminophen 650 MG CR tablet  Commonly known as: TYLENOL Take 650-1,300 mg by mouth 2 (two) times daily as needed for pain.   amLODipine 5 MG tablet Commonly known as: NORVASC Take 5 mg by mouth in the morning.   ASPIRIN EC PO Take 325 mg by mouth in the morning.   CAL-MAG-ZINC PO Take 1 tablet by mouth at bedtime.   diphenhydramine-acetaminophen 25-500 MG Tabs tablet Commonly known as: TYLENOL PM Take 1-2 tablets by mouth at bedtime as needed (pain).   ibuprofen 200 MG tablet Commonly known as: ADVIL Take 200 mg by mouth 2 (two) times daily as needed (pain.).   MAGNESIUM PO Take 1 capsule by mouth at bedtime.   multivitamin with minerals tablet Take 1 tablet by mouth daily after breakfast. One A Day   Repatha SureClick 140 MG/ML Soaj Generic drug: Evolocumab Inject 140 mg into the skin every Thursday.   tiZANidine 2 MG tablet Commonly known  as: ZANAFLEX Take 2 mg by mouth every 8 (eight) hours as needed for muscle spasms.        Disposition: Home   Final Dx: Lumbar laminectomy L3-4 L4-5 with microdiscectomy L3-4 left  Discharge Instructions      Remove dressing in 72 hours   Complete by: As directed    Call MD for:  difficulty breathing, headache or visual disturbances   Complete by: As directed    Call MD for:  persistant nausea and vomiting   Complete by: As directed    Call MD for:  redness, tenderness, or signs of infection (pain, swelling, redness, odor or green/yellow discharge around incision site)   Complete by: As directed    Call MD for:  severe uncontrolled pain   Complete by: As directed    Call MD for:  temperature >100.4   Complete by: As directed    Diet - low sodium heart healthy   Complete by: As directed    Increase activity slowly   Complete by: As directed         Follow-up Information     Arman Bogus, MD. Schedule an appointment as soon as possible for a visit in 2 week(s).   Specialty: Neurosurgery Contact information: [address] [place] [address] [phone]                  Signed: Tia Alert 04/16/2023, 1:32 PM

## 2023-04-16 NOTE — Op Note (Signed)
04/16/2023  1:27 PM  PATIENT:  Sheila Casey  82 y.o. 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 The patient tried conservative measures without relief. Pain was debilitating. Recommended compressive hemilaminectomy with sublaminar decompression and possible microdiscectomy at L3-4. Patient understood the risks, benefits, and alternatives and potential outcomes and wished to proceed.  PROCEDURE DETAILS: The patient was taken to the operating room and after induction of adequate generalized endotracheal anesthesia, the patient was rolled into the prone position on the Wilson frame and all pressure points were padded. The lumbar region was cleaned and then prepped with DuraPrep and draped in the usual sterile fashion. 5 cc of local anesthesia was injected and then a dorsal midline incision was made and carried down to the lumbo sacral fascia. The fascia was opened and the paraspinous musculature was taken down in a subperiosteal fashion to expose L3-4 and L4-5 on the left. Intraoperative x-ray confirmed my level, and then I used a combination of the high-speed drill and the Kerrison punches to perform a hemilaminectomy, medial facetectomy, and foraminotomy at  L3-4 and L4-5 on the left. The underlying yellow ligament was opened and removed in a piecemeal fashion to expose the underlying dura and exiting nerve root. I undercut the lateral recess and dissected down until I was medial to and distal to the pedicle. The nerve root was well decompressed.  We drilled up under the spinous process at both levels and drilled up under the opposite lamina and performed a sublaminar decompression to decompress the central canal in the right lateral recess at both levels.  We then gently retracted the nerve root medially with a retractor, coagulated the epidural venous vasculature, and inspected the L3-4 disc space.  There was a large subannular disc herniation.  We incised the disc space. I performed a thorough intradiscal discectomy with pituitary rongeurs, until I had a nice decompression of the nerve root and the midline. I then palpated with a coronary dilator along the nerve root and into the foramen to assure adequate decompression. I felt no more compression of the nerve root. I irrigated with saline solution containing bacitracin. Achieved hemostasis with bipolar cautery, lined the dura with Gelfoam, and then closed the fascia with 0 Vicryl. I closed the subcutaneous tissues with 2-0 Vicryl and the subcuticular tissues with 3-0 Vicryl. The skin was then closed with benzoin and Steri-Strips. The drapes were removed, a sterile dressing was applied.  My nurse practitioner was involved in the exposure, safe retraction of the neural elements, the disc work and the closure. the patient was awakened from general anesthesia and transferred to the recovery room in stable condition. At the end of the procedure all sponge, needle and instrument counts were correct.    PLAN OF CARE: Discharge to home after PACU  PATIENT DISPOSITION:  PACU -  hemodynamically stable.   Delay start of Pharmacological VTE agent (>24hrs) due to surgical blood loss or risk of bleeding:   yes

## 2023-04-16 NOTE — Anesthesia Procedure Notes (Signed)
Procedure Name: Intubation Date/Time: 04/16/2023 11:28 AM  Performed by: Thomasene Ripple, CRNAPre-anesthesia Checklist: Patient identified, Emergency Drugs available, Suction available and Patient being monitored Patient Re-evaluated:Patient Re-evaluated prior to induction Oxygen Delivery Method: Circle System Utilized Preoxygenation: Pre-oxygenation with 100% oxygen Induction Type: IV induction Ventilation: Mask ventilation without difficulty Grade View: Grade I Tube type: Oral Tube size: 7.0 mm Number of attempts: 1 Airway Equipment and Method: Stylet and Oral airway Placement Confirmation: ETT inserted through vocal cords under direct vision, positive ETCO2 and breath sounds checked- equal and bilateral Secured at: 20 cm Tube secured with: Tape Dental Injury: Teeth and Oropharynx as per pre-operative assessment

## 2023-04-16 NOTE — H&P (Signed)
Subjective: Patient is a 82 y.o. female admitted for lumbar spinal stenosis. Onset of symptoms was several months ago, gradually worsening since that time.  The pain is rated severe, and is located at the across the lower back and radiates to legs left greater than right. The pain is described as aching and occurs all day. The symptoms have been progressive. Symptoms are exacerbated by exercise, extension, standing, and walking for more than a few minutes. MRI or CT showed lumbar spinal stenosis L3-4 and L4-5 with degenerative spondylolisthesis  Past Medical History:  Diagnosis Date   Bulimia    resolved in 1985   Diverticulosis    Guillain Barr syndrome (HCC) 1970   Hyperlipidemia    Hypertension    Internal hemorrhoids    Shingles    Stroke [facility])    Substance abuse (HCC)    ETOH, coccaine, and Speed user, quit in 1987   TIA (transient ischemic attack) 04/2016   Tubular adenoma of colon     Past Surgical History:  Procedure Laterality Date   APPENDECTOMY  1966   COLONOSCOPY     TONSILLECTOMY     TUBAL LIGATION  1981    Prior to Admission medications   Medication Sig Start Date End Date Taking? Authorizing Provider  acetaminophen (TYLENOL) 650 MG CR tablet Take 650-1,300 mg by mouth 2 (two) times daily as needed for pain.   Yes [provider]  amLODipine (NORVASC) 5 MG tablet Take 5 mg by mouth in the morning.   Yes [provider]  Calcium-Magnesium-Zinc (CAL-MAG-ZINC PO) Take 1 tablet by mouth at bedtime.   Yes [provider]  diphenhydramine-acetaminophen (TYLENOL PM) 25-500 MG TABS tablet Take 1-2 tablets by mouth at bedtime as needed (pain).   Yes [provider]  ibuprofen (ADVIL) 200 MG tablet Take 200 mg by mouth 2 (two) times daily as needed (pain.).   Yes [provider]  MAGNESIUM PO Take 1 capsule by mouth at bedtime.   Yes [provider]  Multiple Vitamins-Minerals (MULTIVITAMIN WITH MINERALS) tablet Take 1  tablet by mouth daily after breakfast. One A Day   Yes [provider]  REPATHA SURECLICK 140 MG/ML SOAJ Inject 140 mg into the skin every Thursday.   Yes [provider]  tiZANidine (ZANAFLEX) 2 MG tablet Take 2 mg by mouth every 8 (eight) hours as needed for muscle spasms.   Yes [provider]  ASPIRIN EC PO Take 325 mg by mouth in the morning.    [provider]   Allergies  Allergen Reactions   Influenza Vaccines Other (See Comments)   Sulfonamide Derivatives Anxiety    hyper active    Social History   Tobacco Use   Smoking status: Former   Smokeless tobacco: Never  Substance Use Topics   Alcohol use: No    Family History  Problem Relation Age of Onset   Hypertension Mother    Other Mother        prolapsed bladder   Colon cancer Father        passed away at age 54 with this   Lung cancer Sister        stage 4   Other Child 38       MVA   Stroke Neg Hx      Review of Systems  Positive ROS: neg  All other systems have been reviewed and were otherwise negative with the exception of those mentioned in the HPI and as above.  Objective: Vital signs in last 24 hours: Temp:  [98.1 F (36.7 C)] 98.1 F (36.7 C) (09/20 0847) Pulse Rate:  [98] 98 (09/20 0847) Resp:  [18] 18 (09/20 0847) BP: (138)/(80) 138/80 (09/20 0847) SpO2:  [96 %] 96 % (09/20 0847) Weight:  [65.3 kg] 65.3 kg (09/20 0847)  General Appearance: Alert, cooperative, no distress, appears stated age Head: Normocephalic, without obvious abnormality, atraumatic Eyes: PERRL, conjunctiva/corneas clear, EOM's intact    Neck: Supple, symmetrical, trachea midline Back: Symmetric, no curvature, ROM normal, no CVA tenderness Lungs:  respirations unlabored Heart: Regular rate and rhythm Abdomen: Soft, non-tender Extremities: Extremities normal, atraumatic, no cyanosis or edema Pulses: 2+ and symmetric all extremities Skin: Skin color, texture, turgor normal, no rashes or  lesions  NEUROLOGIC:   Mental status: Alert and oriented x4,  no aphasia, good attention span, fund of knowledge, and memory Motor Exam - grossly normal Sensory Exam - grossly normal Reflexes: 1+ Coordination - grossly normal Gait - grossly normal Balance - grossly normal Cranial Nerves: I: smell Not tested  II: visual acuity  OS: nl    OD: nl  II: visual fields Full to confrontation  II: pupils Equal, round, reactive to light  III,VII: ptosis None  III,IV,VI: extraocular muscles  Full ROM  V: mastication Normal  V: facial light touch sensation  Normal  V,VII: corneal reflex  Present  VII: facial muscle function - upper  Normal  VII: facial muscle function - lower Normal  VIII: hearing Not tested  IX: soft palate elevation  Normal  IX,X: gag reflex Present  XI: trapezius strength  5/5  XI: sternocleidomastoid strength 5/5  XI: neck flexion strength  5/5  XII: tongue strength  Normal    Data Review Lab Results  Component Value Date   WBC 7.0 04/14/2023   HGB 12.8 04/14/2023   HCT 38.7 04/14/2023   MCV 95.3 04/14/2023   PLT 236 04/14/2023   Lab Results  Component Value Date   NA 135 04/14/2023   K 4.0 04/14/2023   CL 104 04/14/2023   CO2 24 04/14/2023   BUN 18 04/14/2023   CREATININE 0.78 04/14/2023   GLUCOSE 98 04/14/2023   Lab Results  Component Value Date   INR 0.9 04/14/2023    Assessment/Plan:  Estimated body mass index is 25.51 kg/m as calculated from the following:   Height as of this encounter: 5\' 3"  (1.6 m).   Weight as of this encounter: 65.3 kg. Patient admitted for laminectomy for lumbar spinal stenosis.  Recommended a left L3-4 and L4-5 decompressive hemilaminectomy with sublaminar decompression. Patient has failed a reasonable attempt at conservative therapy.  I explained the condition and procedure to the patient and answered any questions.  Patient wishes to proceed with procedure as planned. Understands risks/ benefits and typical  outcomes of procedure.   Tia Alert 04/16/2023 10:29 AM

## 2023-04-17 ENCOUNTER — Encounter (HOSPITAL_COMMUNITY): Payer: Self-pay | Admitting: Neurological Surgery

## 2023-04-18 ENCOUNTER — Other Ambulatory Visit: Payer: Self-pay

## 2023-04-20 MED FILL — Thrombin For Soln 5000 Unit: CUTANEOUS | Qty: 2 | Status: AC

## 2023-04-20 NOTE — Anesthesia Postprocedure Evaluation (Signed)
Anesthesia Post Note  Patient: Keliana Sweeley Aramburo  Procedure(s) Performed: Laminectomy and Foraminotomy - Lumbar Three-Four/Lumbar Four- Lumbar Five- Left, sublaminar decompression (Left: Back)     Patient location during evaluation: PACU Anesthesia Type: General Level of consciousness: awake and alert Pain management: pain level controlled Vital Signs Assessment: post-procedure vital signs reviewed and stable Respiratory status: spontaneous breathing, nonlabored ventilation, respiratory function stable and patient connected to nasal cannula oxygen Cardiovascular status: blood pressure returned to baseline and stable Postop Assessment: no apparent nausea or vomiting Anesthetic complications: no   No notable events documented.  Last Vitals:  Vitals:   04/16/23 1430 04/16/23 1445  BP: 138/76 (!) 131/99  Pulse: 79 83  Resp: 20 18  Temp: (!) 36.3 C (!) 36.3 C  SpO2: 97% 97%    Last Pain:  Vitals:   04/16/23 1445  TempSrc:   PainSc: 3    Pain Goal: Patients Stated Pain Goal: 3 (04/16/23 1445)                 Linley Moskal

## 2023-05-03 ENCOUNTER — Other Ambulatory Visit: Payer: Self-pay

## 2023-05-03 ENCOUNTER — Observation Stay (HOSPITAL_COMMUNITY): Payer: Medicare Other

## 2023-05-03 ENCOUNTER — Emergency Department (HOSPITAL_COMMUNITY): Payer: Medicare Other

## 2023-05-03 ENCOUNTER — Encounter (HOSPITAL_COMMUNITY): Payer: Self-pay | Admitting: Student

## 2023-05-03 ENCOUNTER — Observation Stay (HOSPITAL_COMMUNITY)
Admission: EM | Admit: 2023-05-03 | Discharge: 2023-05-04 | Disposition: A | Discharge status: Home / Self Care | Payer: Medicare Other | Attending: Family Medicine | Admitting: Family Medicine

## 2023-05-03 DIAGNOSIS — Z8673 Personal history of transient ischemic attack (TIA), and cerebral infarction without residual deficits: Secondary | ICD-10-CM | POA: Insufficient documentation

## 2023-05-03 DIAGNOSIS — R41841 Cognitive communication deficit: Secondary | ICD-10-CM | POA: Diagnosis not present

## 2023-05-03 DIAGNOSIS — Z79899 Other long term (current) drug therapy: Secondary | ICD-10-CM | POA: Insufficient documentation

## 2023-05-03 DIAGNOSIS — Z87891 Personal history of nicotine dependence: Secondary | ICD-10-CM | POA: Insufficient documentation

## 2023-05-03 DIAGNOSIS — M549 Dorsalgia, unspecified: Secondary | ICD-10-CM | POA: Insufficient documentation

## 2023-05-03 DIAGNOSIS — I639 Cerebral infarction, unspecified: Secondary | ICD-10-CM | POA: Diagnosis not present

## 2023-05-03 DIAGNOSIS — I10 Essential (primary) hypertension: Secondary | ICD-10-CM | POA: Insufficient documentation

## 2023-05-03 DIAGNOSIS — R29818 Other symptoms and signs involving the nervous system: Principal | ICD-10-CM | POA: Insufficient documentation

## 2023-05-03 DIAGNOSIS — R2990 Unspecified symptoms and signs involving the nervous system: Secondary | ICD-10-CM | POA: Diagnosis not present

## 2023-05-03 DIAGNOSIS — Z7982 Long term (current) use of aspirin: Secondary | ICD-10-CM | POA: Diagnosis not present

## 2023-05-03 LAB — CBC
HCT: 35.8 % — ABNORMAL LOW (ref 36.0–46.0)
Hemoglobin: 11.6 g/dL — ABNORMAL LOW (ref 12.0–15.0)
MCH: 30.4 pg (ref 26.0–34.0)
MCHC: 32.4 g/dL (ref 30.0–36.0)
MCV: 93.7 fL (ref 80.0–100.0)
Platelets: 338 10*3/uL (ref 150–400)
RBC: 3.82 MIL/uL — ABNORMAL LOW (ref 3.87–5.11)
RDW: 12.7 % (ref 11.5–15.5)
WBC: 8.9 10*3/uL (ref 4.0–10.5)
nRBC: 0 % (ref 0.0–0.2)

## 2023-05-03 LAB — HEMOGLOBIN A1C
Hgb A1c MFr Bld: 5.5 % (ref 4.8–5.6)
Mean Plasma Glucose: 111.15 mg/dL

## 2023-05-03 LAB — COMPREHENSIVE METABOLIC PANEL WITH GFR
ALT: 16 U/L (ref 0–44)
AST: 16 U/L (ref 15–41)
Albumin: 3.9 g/dL (ref 3.5–5.0)
Alkaline Phosphatase: 69 U/L (ref 38–126)
Anion gap: 11 (ref 5–15)
BUN: 17 mg/dL (ref 8–23)
CO2: 22 mmol/L (ref 22–32)
Calcium: 9.3 mg/dL (ref 8.9–10.3)
Chloride: 105 mmol/L (ref 98–111)
Creatinine, Ser: 0.93 mg/dL (ref 0.44–1.00)
GFR, Estimated: >60 mL/min
Glucose, Bld: 104 mg/dL — ABNORMAL HIGH (ref 70–99)
Potassium: 4.1 mmol/L (ref 3.5–5.1)
Sodium: 138 mmol/L (ref 135–145)
Total Bilirubin: 0.4 mg/dL (ref 0.3–1.2)
Total Protein: 6.8 g/dL (ref 6.5–8.1)

## 2023-05-03 LAB — DIFFERENTIAL
Abs Immature Granulocytes: 0.05 10*3/uL (ref 0.00–0.07)
Basophils Absolute: 0 10*3/uL (ref 0.0–0.1)
Basophils Relative: 0 %
Eosinophils Absolute: 0 10*3/uL (ref 0.0–0.5)
Eosinophils Relative: 0 %
Immature Granulocytes: 1 %
Lymphocytes Relative: 23 %
Lymphs Abs: 2.1 10*3/uL (ref 0.7–4.0)
Monocytes Absolute: 0.6 10*3/uL (ref 0.1–1.0)
Monocytes Relative: 6 %
Neutro Abs: 6.2 10*3/uL (ref 1.7–7.7)
Neutrophils Relative %: 70 %

## 2023-05-03 LAB — URINALYSIS, ROUTINE W REFLEX MICROSCOPIC
Bilirubin Urine: NEGATIVE
Glucose, UA: NEGATIVE mg/dL
Hgb urine dipstick: NEGATIVE
Ketones, ur: NEGATIVE mg/dL
Leukocytes,Ua: NEGATIVE
Nitrite: NEGATIVE
Protein, ur: NEGATIVE mg/dL
Specific Gravity, Urine: 1.02 (ref 1.005–1.030)
pH: 7 (ref 5.0–8.0)

## 2023-05-03 LAB — TSH: TSH: 1.754 u[IU]/mL (ref 0.350–4.500)

## 2023-05-03 LAB — I-STAT CHEM 8, ED
BUN: 19 mg/dL (ref 8–23)
Calcium, Ion: 1.17 mmol/L (ref 1.15–1.40)
Chloride: 107 mmol/L (ref 98–111)
Creatinine, Ser: 0.9 mg/dL (ref 0.44–1.00)
Glucose, Bld: 100 mg/dL — ABNORMAL HIGH (ref 70–99)
HCT: 36 % (ref 36.0–46.0)
Hemoglobin: 12.2 g/dL (ref 12.0–15.0)
Potassium: 4.2 mmol/L (ref 3.5–5.1)
Sodium: 140 mmol/L (ref 135–145)
TCO2: 21 mmol/L — ABNORMAL LOW (ref 22–32)

## 2023-05-03 LAB — APTT: aPTT: 28 s (ref 24–36)

## 2023-05-03 LAB — ETHANOL: Alcohol, Ethyl (B): <10 mg/dL (ref <10)

## 2023-05-03 LAB — LIPID PANEL
Cholesterol: 135 mg/dL (ref 0–200)
HDL: 58 mg/dL (ref >40)
LDL Cholesterol: 50 mg/dL (ref 0–99)
Total CHOL/HDL Ratio: 2.3 {ratio}
Triglycerides: 137 mg/dL (ref <150)
VLDL: 27 mg/dL (ref 0–40)

## 2023-05-03 LAB — CBG MONITORING, ED: Glucose-Capillary: 105 mg/dL — ABNORMAL HIGH (ref 70–99)

## 2023-05-03 LAB — PROTIME-INR
INR: 0.9 (ref 0.8–1.2)
Prothrombin Time: 12.4 s (ref 11.4–15.2)

## 2023-05-03 MED ORDER — PANTOPRAZOLE SODIUM 40 MG PO TBEC
40.0000 mg | DELAYED_RELEASE_TABLET | Freq: Every day | ORAL | Status: DC
  Administered 2023-05-03 – 2023-05-04 (×2): 40 mg via ORAL
  Filled 2023-05-03 (×2): qty 1

## 2023-05-03 MED ORDER — ACETAMINOPHEN 650 MG RE SUPP: 650.0000 mg | RECTAL | Status: DC | PRN

## 2023-05-03 MED ORDER — IOHEXOL 350 MG/ML SOLN
75.0000 mL | Freq: Once | INTRAVENOUS | Status: AC | PRN
  Administered 2023-05-03: 75 mL via INTRAVENOUS

## 2023-05-03 MED ORDER — ACETAMINOPHEN 325 MG PO TABS: 650.0000 mg | ORAL_TABLET | ORAL | Status: DC | PRN

## 2023-05-03 MED ORDER — TIZANIDINE HCL 4 MG PO TABS: 2.0000 mg | ORAL_TABLET | Freq: Three times a day (TID) | ORAL | Status: DC | PRN

## 2023-05-03 MED ORDER — IBUPROFEN 200 MG PO TABS: 200.0000 mg | ORAL_TABLET | Freq: Two times a day (BID) | ORAL | Status: DC | PRN

## 2023-05-03 MED ORDER — ENOXAPARIN SODIUM 40 MG/0.4ML IJ SOSY
40.0000 mg | PREFILLED_SYRINGE | INTRAMUSCULAR | Status: DC
  Administered 2023-05-03: 40 mg via SUBCUTANEOUS
  Filled 2023-05-03: qty 0.4

## 2023-05-03 MED ORDER — SODIUM CHLORIDE 0.9 % IV SOLN: INTRAVENOUS | Status: DC

## 2023-05-03 MED ORDER — STROKE: EARLY STAGES OF RECOVERY BOOK
Freq: Once | Status: AC
  Filled 2023-05-03: qty 1

## 2023-05-03 MED ORDER — SENNOSIDES-DOCUSATE SODIUM 8.6-50 MG PO TABS: 1.0000 | ORAL_TABLET | Freq: Every evening | ORAL | Status: DC | PRN

## 2023-05-03 MED ORDER — SODIUM CHLORIDE 0.9% FLUSH: 3.0000 mL | Freq: Once | INTRAVENOUS | Status: DC

## 2023-05-03 MED ORDER — ASPIRIN 81 MG PO TBEC
81.0000 mg | DELAYED_RELEASE_TABLET | Freq: Every day | ORAL | Status: DC
  Administered 2023-05-03 – 2023-05-04 (×2): 81 mg via ORAL
  Filled 2023-05-03 (×2): qty 1

## 2023-05-03 MED ORDER — ACETAMINOPHEN 160 MG/5ML PO SOLN: 650.0000 mg | ORAL | Status: DC | PRN

## 2023-05-03 NOTE — Plan of Care (Signed)
  Problem: Education: Goal: Knowledge of disease or condition will improve Outcome: Progressing Goal: Knowledge of secondary prevention will improve (MUST DOCUMENT ALL) Outcome: Progressing   

## 2023-05-03 NOTE — ED Notes (Signed)
Pt ambulated to RR with steady gait

## 2023-05-03 NOTE — Consult Note (Signed)
NEUROLOGY CONSULTATION NOTE   Date of service: May 03, 2023 Patient Name: Sheila Casey MRN:  [id number] DOB:  06-21-1941 Reason for consult: "CODE STROKE" Requesting Provider: Gwyneth Sprout, MD  History of Present Illness  Sheila Casey is a 82 y.o. female with PMH significant for TIA October 2017 and 2023, recent back sx (2 weeks ago), HLD, HTN, GBS (at 82 years old), Shingles,. Her neurologic examination is notable for left leg weakness, left arm and leg numbness (worse in left leg), left leg ataxia and aphasia.  NIH: 4. She woke up at 0300 and began experiencing these symptoms after going to bed around 2330 last night. She is out of the window for TNK, along with the recent surgery serving as an exclusion. CT and CTA negative.    LKW: 2330 mRS: 0 tNKASE: no, outside of window along with recent surgery Thrombectomy: no, no LVO  NIHSS components Score: Comment  1a Level of Conscious 0[x]  1[]  2[]  3[]      1b LOC Questions 0[x]  1[]  2[]      Did not answer month at first but corrected herself and was able to correctly answer on subsequent questioning.   1c LOC Commands 0[x]  1[]  2[]       2 Best Gaze 0[x]  1[]  2[]       3 Visual 0[]  1[]  2[]  3[]      4 Facial Palsy 0[x]  1[]  2[]  3[]      5a Motor Arm - left 0[x]  1[]  2[]  3[]  4[]  UN[]    5b Motor Arm - Right 0[x]  1[]  2[]  3[]  4[]  UN[]    6a Motor Leg - Left 0[]  1[x]  2[]  3[]  4[]  UN[]    6b Motor Leg - Right 0[x]  1[]  2[]  3[]  4[]  UN[]    7 Limb Ataxia 0[]  1[x]  2[]  3[]  UN[]     8 Sensory 0[]  1[x]  2[]  UN[]      9 Best Language 0[]  1[x]  2[]  3[]      10 Dysarthria 0[x]  1[]  2[]  UN[]      11 Extinct. and Inattention 0[x]  1[]  2[]       TOTAL:    4       ROS   Constitutional Denies weight loss, fever and chills.   HEENT Denies changes in vision and hearing.   Respiratory Denies SOB and cough.   CV Denies palpitations and CP   GI Denies abdominal pain, nausea, vomiting and diarrhea.   GU Denies dysuria and urinary frequency.   MSK Denies myalgia  and joint pain.   Skin Denies rash and pruritus.   Neurological Denies headache and syncope.   Psychiatric Denies recent changes in mood. Denies anxiety and depression.    Past History   Past Medical History:  Diagnosis Date   Bulimia    resolved in 1985   Diverticulosis    Guillain Barr syndrome (HCC) 1970   Hyperlipidemia    Hypertension    Internal hemorrhoids    Shingles    Stroke [facility])    Substance abuse (HCC)    ETOH, coccaine, and Speed user, quit in 1987   TIA (transient ischemic attack) 04/2016   Tubular adenoma of colon    Past Surgical History:  Procedure Laterality Date   APPENDECTOMY  1966   COLONOSCOPY     LUMBAR LAMINECTOMY/DECOMPRESSION MICRODISCECTOMY Left 04/16/2023   Procedure: Laminectomy and Foraminotomy - Lumbar Three-Four/Lumbar Four- Lumbar Five- Left, sublaminar decompression;  Surgeon: Arman Bogus, MD;  Location: [facility] OR;  Service: Neurosurgery;  Laterality: Left;  3C   TONSILLECTOMY     TUBAL  LIGATION  1981   Family History  Problem Relation Age of Onset   Hypertension Mother    Other Mother        prolapsed bladder   Colon cancer Father        passed away at age 71 with this   Lung cancer Sister        stage 4   Other Child 82       MVA   Stroke Neg Hx    Social History   Socioeconomic History   Marital status: Married    Spouse name: Not on file   Number of children: Not on file   Years of education: Not on file   Highest education level: Not on file  Occupational History   Occupation: [employer]    Comment: Programmer, multimedia  Tobacco Use   Smoking status: Former   Smokeless tobacco: Never  Advertising account planner   Vaping status: Never Used  Substance and Sexual Activity   Alcohol use: No   Drug use: No    Comment: clean for 30 years   Sexual activity: Yes    Birth control/protection: Post-menopausal  Other Topics Concern   Not on file  Social History Narrative   Not on file   Social Determinants of  Health   Financial Resource Strain: Not on file  Food Insecurity: Not on file  Transportation Needs: Not on file  Physical Activity: Not on file  Stress: Not on file  Social Connections: Not on file   Allergies  Allergen Reactions   Influenza Vaccines Other (See Comments)   Sulfonamide Derivatives Anxiety    hyper active    Medications  (Not in a hospital admission)    Vitals   Vitals:   05/03/23 1340 05/03/23 1344 05/03/23 1355 05/03/23 1358  BP: (!) 158/98  (!) 153/71   Pulse: (!) 116  (!) 108   Resp:   11   Temp:    98.2 F (36.8 C)  TempSrc:    Oral  SpO2:   98%   Weight:      Height:  5\' 3"  (1.6 m)       Body mass index is 26.01 kg/m.  Physical Exam   General: Laying comfortably in bed; in no acute distress.  HENT: Normal oropharynx and mucosa. Normal external appearance of ears and nose.  Neck: Supple, no pain or tenderness  CV: No JVD. No peripheral edema.  Pulmonary: Symmetric Chest rise. Normal respiratory effort.  Abdomen: Soft to touch, non-tender.  Ext: No cyanosis, edema, or deformity  Skin: No rash. Normal palpation of skin.   Musculoskeletal: Normal digits and nails by inspection. No clubbing.   Neurologic Examination  Mental status/Cognition: Alert, oriented to self, place, month and year, good attention. Did not answer what month it is at first, but did correct herself and was able to state it on subsequent questioning.  Speech/language: Fluent, comprehension intact, object naming intact, repetition intact. Paraphasic errors notes during assessment using pictures to identify (states the stool was a stick,) and naming (called a thumb a numb).  Cranial nerves:   CN II Pupils equal and reactive to light, no VF deficits    CN III,IV,VI EOM intact, no gaze preference or deviation, no nystagmus    CN V normal sensation in V1, V2, and V3 segments bilaterally    CN VII no asymmetry, no nasolabial fold flattening    CN VIII normal hearing to speech  CN IX & X normal palatal elevation, no uvular deviation    CN XI 5/5 head turn and 5/5 shoulder shrug bilaterally    CN XII midline tongue protrusion    Motor:   LUE: 4+/5, no drift LLE: 4+/5 with drift RUE/RLE: 5/5, no drift  Sensation: Decreased to light touch on LUE and LLE, worse on LLE.   Coordination/Complex Motor:  - Finger to Nose: intact bilaterally.  - Heel to shin: Ataxia present on LLE.  - Gait: Deferred for patient safety.   Labs   CBC:  Recent Labs  Lab 05/03/23 1340 05/03/23 1343  WBC 8.9  --   NEUTROABS 6.2  --   HGB 11.6* 12.2  HCT 35.8* 36.0  MCV 93.7  --   PLT 338  --     Basic Metabolic Panel:  Lab Results  Component Value Date   NA 140 05/03/2023   K 4.2 05/03/2023   CO2 24 04/14/2023   GLUCOSE 100 (H) 05/03/2023   BUN 19 05/03/2023   CREATININE 0.90 05/03/2023   CALCIUM 9.5 04/14/2023   GFRNONAA >60 04/14/2023   GFRAA >60 05/11/2016   Lipid Panel:  Lab Results  Component Value Date   LDLCALC 148 (H) 05/12/2016   HgbA1c:  Lab Results  Component Value Date   HGBA1C 5.6 05/12/2016   Urine Drug Screen:     Component Value Date/Time   LABOPIA NONE DETECTED 05/11/2016 0944   COCAINSCRNUR NONE DETECTED 05/11/2016 0944   LABBENZ NONE DETECTED 05/11/2016 0944   AMPHETMU NONE DETECTED 05/11/2016 0944   THCU NONE DETECTED 05/11/2016 0944   LABBARB NONE DETECTED 05/11/2016 0944    Alcohol Level No results found for: "ETH" INR  Lab Results  Component Value Date   INR 0.9 04/14/2023   APTT  Lab Results  Component Value Date   APTT 30 05/11/2016     CT Head without contrast(Personally reviewed): No hemorrhage or evidence of cortical infarct. 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   CT angio Head and Neck with contrast(Personally reviewed): No LVO    Impression   Sheila Casey is a 82 y.o. female with PMH significant for TIA October 2017 and 2023, recent back sx (2 weeks ago), HLD, HTN, GBS (at 82 years old),  Shingles,. Her neurologic examination is notable for left leg weakness, left arm and leg numbness (worse in left leg), left leg ataxia and aphasia.  NIH: 4. She woke up at 0300 and began experiencing these symptoms after going to bed around 2330 last night. She is out of the window for TNK, along with the recent surgery serving as an exclusion. CT and CTA negative.   Primary Diagnosis:  Stroke-like symptoms versus Stroke pending MRI  Secondary Diagnosis: Essential (primary) hypertension Hyperlipidemia Hx of TIAs  Recommendations  - Admit for stroke work-up  - Frequent Neuro checks per stroke unit protocol - MRI Brain stroke protocol - TTE - Lipid panel - Statin - will be started if LDL>70 or otherwise medically indicated - A1C - Antithrombotic - Patient takes aspirin at home. Recommend DAPT for 3 weeks, If MRI positive for stroke, will recommend changing home medication to Plavix.  - DVT ppx - Smoking cessation - will counsel patient - SBP goal - <220, PRN labetalol if HR>60 and PRN Hydralazine if HR<60 - Telemetry monitoring for arrhythmia - 72h - Swallow screen - will be performed prior to PO intake - Stroke education - will be given - PT/OT/SLP - Dispo: admit for stroke workup.  ______________________________________________________________________   Thank you for the opportunity to take part in the care of this patient. If you have any further questions, please contact the neurology consultation attending.  Signed,   NEUROHOSPITALIST ADDENDUM Performed a face to face diagnostic evaluation.   I have reviewed the contents of history and physical exam as documented by PA/ARNP/Resident and agree with above documentation.  I have discussed and formulated the above plan as documented. Edits to the note have been made as needed.  Impression/Key exam findings/Plan: 64F p/w mild left sided weakness and makes paraphasic errors when talking. Was at her baseline when she went to bed  at 2300 and woke up with these symptoms. Did not improve so she came to the ED.  Exam with mild L sided weakness and makes paraphasic errors while talking and some hesitancy with some words. Suspect small stroke. CTH was negative and she was outside window for tnkase. CTA with no LVO. Medicine admission for stroke workup as above.  Erick Blinks, MD [facility] [phone]   If 7pm to 7am, please call on call as listed on AMION.

## 2023-05-03 NOTE — ED Notes (Signed)
ED TO INPATIENT HANDOFF REPORT  ED Nurse Name and Phone #: Waunita Schooner Name/Age/Gender Sheila Casey 83 y.o. female Room/Bed: RESUSC/RESUSC  Code Status   Code Status: Full Code  Home/SNF/Other Home Patient oriented to: self, place, time, and situation Is this baseline? Yes   Triage Complete: Triage complete  Chief Complaint Stroke-like symptom [R29.90]  Triage Note Code stroke. LKW at 2300. Came in for aphasia and left sided weakness and numbness. A&Ox4.   Allergies Allergies  Allergen Reactions   Atorvastatin Other (See Comments)    myalgias   Influenza Vaccines Other (See Comments)   Sulfonamide Derivatives Anxiety    hyper active    Level of Care/Admitting Diagnosis ED Disposition     ED Disposition  Admit   Condition  --   Comment  Hospital Area: [facility] [[id number]]  Level of Care: Telemetry Cardiac [103]  May place patient in observation at [facility] or [facility] if equivalent level of care is available:: Yes  Covid Evaluation: Asymptomatic - no recent exposure (last 10 days) testing not required  Diagnosis: Stroke-like symptom [161096]  Admitting Physician: Gillis Santa [[id number]]  Attending Physician: Clearance Coots          B Medical/Surgery History Past Medical History:  Diagnosis Date   Bulimia    resolved in 1985   Diverticulosis    Guillain Barr syndrome (HCC) 1970   Hyperlipidemia    Hypertension    Internal hemorrhoids    Shingles    Stroke (HCC)    Substance abuse (HCC)    ETOH, coccaine, and Speed user, quit in 1987   TIA (transient ischemic attack) 04/2016   Tubular adenoma of colon    Past Surgical History:  Procedure Laterality Date   APPENDECTOMY  1966   COLONOSCOPY     LUMBAR LAMINECTOMY/DECOMPRESSION MICRODISCECTOMY Left 04/16/2023   Procedure: Laminectomy and Foraminotomy - Lumbar Three-Four/Lumbar Four- Lumbar Five- Left, sublaminar decompression;  Surgeon: Arman Bogus, MD;   Location: [facility] OR;  Service: Neurosurgery;  Laterality: Left;  3C   TONSILLECTOMY     TUBAL LIGATION  1981     A IV Location/Drains/Wounds Patient Lines/Drains/Airways Status     Active Line/Drains/Airways     Name Placement date Placement time Site Days   Peripheral IV 05/03/23 18 G Anterior;Left Forearm 05/03/23  1343  Forearm  less than 1   Peripheral IV 05/03/23 18 G Left Antecubital 05/03/23  1344  Antecubital  less than 1            Intake/Output Last 24 hours No intake or output data in the 24 hours ending 05/03/23 1622  Labs/Imaging Results for orders placed or performed during the hospital encounter of 05/03/23 (from the past 48 hour(s))  CBG monitoring, ED     Status: Abnormal   Collection Time: 05/03/23  1:37 PM  Result Value Ref Range   Glucose-Capillary 105 (H) 70 - 99 mg/dL    Comment: Glucose reference range applies only to samples taken after fasting for at least 8 hours.  Protime-INR     Status: None   Collection Time: 05/03/23  1:40 PM  Result Value Ref Range   Prothrombin Time 12.4 11.4 - 15.2 seconds   INR 0.9 0.8 - 1.2    Comment: (NOTE) INR goal varies based on device and disease states. Performed at [facility], [address] [place], [place] [address]   APTT     Status: None  Collection Time: 05/03/23  1:40 PM  Result Value Ref Range   aPTT 28 24 - 36 seconds    Comment: Performed at [facility], [address], [place], [place] [address]  CBC     Status: Abnormal   Collection Time: 05/03/23  1:40 PM  Result Value Ref Range   WBC 8.9 4.0 - 10.5 K/uL   RBC 3.82 (L) 3.87 - 5.11 MIL/uL   Hemoglobin 11.6 (L) 12.0 - 15.0 g/dL   HCT 60.4 (L) 54.0 - 98.1 %   MCV 93.7 80.0 - 100.0 fL   MCH 30.4 26.0 - 34.0 pg   MCHC 32.4 30.0 - 36.0 g/dL   RDW 19.1 47.8 - 29.5 %   Platelets 338 150 - 400 K/uL   nRBC 0.0 0.0 - 0.2 %    Comment: Performed at [facility], [address] [place], [place] [address]  Differential      Status: None   Collection Time: 05/03/23  1:40 PM  Result Value Ref Range   Neutrophils Relative % 70 %   Neutro Abs 6.2 1.7 - 7.7 K/uL   Lymphocytes Relative 23 %   Lymphs Abs 2.1 0.7 - 4.0 K/uL   Monocytes Relative 6 %   Monocytes Absolute 0.6 0.1 - 1.0 K/uL   Eosinophils Relative 0 %   Eosinophils Absolute 0.0 0.0 - 0.5 K/uL   Basophils Relative 0 %   Basophils Absolute 0.0 0.0 - 0.1 K/uL   Immature Granulocytes 1 %   Abs Immature Granulocytes 0.05 0.00 - 0.07 K/uL    Comment: Performed at [facility], [address] [place], [place] [address]  Comprehensive metabolic panel     Status: Abnormal   Collection Time: 05/03/23  1:40 PM  Result Value Ref Range   Sodium 138 135 - 145 mmol/L   Potassium 4.1 3.5 - 5.1 mmol/L   Chloride 105 98 - 111 mmol/L   CO2 22 22 - 32 mmol/L   Glucose, Bld 104 (H) 70 - 99 mg/dL    Comment: Glucose reference range applies only to samples taken after fasting for at least 8 hours.   BUN 17 8 - 23 mg/dL   Creatinine, Ser 4.69 0.44 - 1.00 mg/dL   Calcium 9.3 8.9 - 62.9 mg/dL   Total Protein 6.8 6.5 - 8.1 g/dL   Albumin 3.9 3.5 - 5.0 g/dL   AST 16 15 - 41 U/L   ALT 16 0 - 44 U/L   Alkaline Phosphatase 69 38 - 126 U/L   Total Bilirubin 0.4 0.3 - 1.2 mg/dL   GFR, Estimated >52 >84 mL/min    Comment: (NOTE) Calculated using the CKD-EPI Creatinine Equation (2021)    Anion gap 11 5 - 15    Comment: Performed at [facility], [address] [place], [place] [address]  Ethanol     Status: None   Collection Time: 05/03/23  1:40 PM  Result Value Ref Range   Alcohol, Ethyl (B) <10 <10 mg/dL    Comment: (NOTE) Lowest detectable limit for serum alcohol is 10 mg/dL.  For medical purposes only. Performed at [facility], [address] [place], [place] [address]   I-stat chem 8, ED     Status: Abnormal   Collection Time: 05/03/23  1:43 PM  Result Value Ref Range   Sodium 140 135 - 145 mmol/L   Potassium 4.2 3.5 - 5.1 mmol/L    Chloride 107  98 - 111 mmol/L   BUN 19 8 - 23 mg/dL   Creatinine, Ser 5.40 0.44 - 1.00 mg/dL   Glucose, Bld 981 (H) 70 - 99 mg/dL    Comment: Glucose reference range applies only to samples taken after fasting for at least 8 hours.   Calcium, Ion 1.17 1.15 - 1.40 mmol/L   TCO2 21 (L) 22 - 32 mmol/L   Hemoglobin 12.2 12.0 - 15.0 g/dL   HCT 19.1 47.8 - 29.5 %  Urinalysis, Routine w reflex microscopic -Urine, Clean Catch     Status: Abnormal   Collection Time: 05/03/23  3:00 PM  Result Value Ref Range   Color, Urine STRAW (A) YELLOW   APPearance CLEAR CLEAR   Specific Gravity, Urine 1.020 1.005 - 1.030   pH 7.0 5.0 - 8.0   Glucose, UA NEGATIVE NEGATIVE mg/dL   Hgb urine dipstick NEGATIVE NEGATIVE   Bilirubin Urine NEGATIVE NEGATIVE   Ketones, ur NEGATIVE NEGATIVE mg/dL   Protein, ur NEGATIVE NEGATIVE mg/dL   Nitrite NEGATIVE NEGATIVE   Leukocytes,Ua NEGATIVE NEGATIVE    Comment: Performed at [facility], [address] [place], [place] [address]   CT ANGIO HEAD NECK W WO CM (CODE STROKE)  Result Date: 05/03/2023 CLINICAL DATA:  Neuro deficit, acute, stroke suspected EXAM: CT ANGIOGRAPHY HEAD AND NECK WITH AND WITHOUT CONTRAST TECHNIQUE: Multidetector CT imaging of the head and neck was performed using the standard protocol during bolus administration of intravenous contrast. Multiplanar CT image reconstructions and MIPs were obtained to evaluate the vascular anatomy. Carotid stenosis measurements (when applicable) are obtained utilizing NASCET criteria, using the distal internal carotid diameter as the denominator. RADIATION DOSE REDUCTION: This exam was performed according to the departmental dose-optimization program which includes automated exposure control, adjustment of the [place] Olmo and/or kV according to patient size and/or use of iterative reconstruction technique. CONTRAST:  75mL OMNIPAQUE IOHEXOL 350 MG/ML SOLN COMPARISON:  None Available. FINDINGS: CT HEAD FINDINGS See same day CT  head for intracranial findings CTA NECK FINDINGS Aortic arch: Standard branching. Imaged portion shows no evidence of aneurysm or dissection. No significant stenosis of the major arch vessel origins. Right carotid system: No evidence of dissection, stenosis (50% or greater), or occlusion. Left carotid system: No evidence of dissection, stenosis (50% or greater), or occlusion. There is mild focal narrowing in the proximal left ICA secondary to soft atherosclerotic plaque. Vertebral arteries: Left dominant. No evidence of dissection, stenosis (50% or greater), or occlusion. Skeleton: Negative. Grade 1 anterolisthesis of C4 on C5 and trace retrolisthesis of C5 on C6. Other neck: Negative. Upper chest: Negative. Review of the MIP images confirms the above findings CTA HEAD FINDINGS Anterior circulation: No significant stenosis, proximal occlusion, aneurysm, or vascular malformation. Posterior circulation: No significant stenosis, proximal occlusion, aneurysm, or vascular malformation. Venous sinuses: As permitted by contrast timing, patent. Anatomic variants: None Review of the MIP images confirms the above findings IMPRESSION: 1. No intracranial large vessel occlusion or significant stenosis. 2. No hemodynamically significant stenosis in the neck. 3. See same day CT head for intracranial findings. Electronically Signed   By: Lorenza Cambridge M.D.   On: 05/03/2023 14:29   CT HEAD CODE STROKE WO CONTRAST  Result Date: 05/03/2023 CLINICAL DATA:  Code stroke.  Left-sided weakness EXAM: CT HEAD WITHOUT CONTRAST TECHNIQUE: Contiguous axial images were obtained from the base of the skull through the vertex without intravenous contrast. RADIATION DOSE REDUCTION: This exam was performed according to the departmental dose-optimization program which  includes automated exposure control, adjustment of the [place] and/or kV according to patient size and/or use of iterative reconstruction technique. COMPARISON:  Brain MR 05/11/16  FINDINGS: Brain: No hemorrhage. No hydrocephalus. No extra-axial fluid collection. There is an age indeterminate, but likely a chronic infarct in the left parietal lobe. Chronic left cerebellar infarct. No mass effect. No mass lesion. Mineralization of the basal ganglia bilaterally. Vascular: No hyperdense vessel or unexpected calcification. Skull: Normal. Negative for fracture or focal lesion. Dermal calcifications are present. Sinuses/Orbits: Middle ear or mastoid. Paranasal sinuses are clear. Orbits are unremarkable. Other: None. ASPECTS [facility] Stroke Program Early CT Score): 10. Given patient's symptoms, the age indeterminate infarct in the left parietal lobe is likely chronic. IMPRESSION: 1.  No hemorrhage or CT evidence of a right sided cortical infarct. 2. 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 Findings were paged to Dr. Derry Lory on 05/03/23 at 1:55 PM via [facility] paging system. Electronically Signed   By: Lorenza Cambridge M.D.   On: 05/03/2023 13:55    Pending Labs Unresulted Labs (From admission, onward)     Start     Ordered   05/04/23 0500  CBC  Daily,   R        05/03/23 1529   05/04/23 0500  Basic metabolic panel  Daily,   R        05/03/23 1529   05/04/23 0500  Phosphorus  Daily,   R        05/03/23 1529   05/04/23 0500  Magnesium  Daily,   R        05/03/23 1529   05/03/23 1528  Hemoglobin A1c  (Labs)  Add-on,   AD       Comments: To assess prior glycemic control    05/03/23 1529   05/03/23 1528  Lipid panel  (Labs)  Add-on,   AD       Comments: Fasting    05/03/23 1529   05/03/23 1528  TSH  Add-on,   AD        05/03/23 1529            Vitals/Pain Today's Vitals   05/03/23 1500 05/03/23 1530 05/03/23 1600 05/03/23 1618  BP: (!) 147/93 (!) 133/99 (!) 133/91   Pulse: (!) 108 (!) 106 (!) 112   Resp: (!) 27 (!) 28 (!) 25   Temp:    97.9 F (36.6 C)  TempSrc:    Oral  SpO2: 98% 97% 98%   Weight:      Height:      PainSc:         Isolation Precautions No active isolations  Medications Medications  sodium chloride flush (NS) 0.9 % injection 3 mL (3 mLs Intravenous Not Given 05/03/23 1359)   stroke: early stages of recovery book (has no administration in time range)  0.9 %  sodium chloride infusion ( Intravenous New Bag/Given 05/03/23 1617)  acetaminophen (TYLENOL) tablet 650 mg (has no administration in time range)    Or  acetaminophen (TYLENOL) 160 MG/5ML solution 650 mg (has no administration in time range)    Or  acetaminophen (TYLENOL) suppository 650 mg (has no administration in time range)  senna-docusate (Senokot-S) tablet 1 tablet (has no administration in time range)  enoxaparin (LOVENOX) injection 40 mg (40 mg Subcutaneous Given 05/03/23 1616)  aspirin EC tablet 81 mg (81 mg Oral Given 05/03/23 1616)  tiZANidine (ZANAFLEX) tablet 2 mg (has no administration in time range)  ibuprofen (ADVIL) tablet  200 mg (has no administration in time range)  pantoprazole (PROTONIX) EC tablet 40 mg (40 mg Oral Given 05/03/23 1616)  iohexol (OMNIPAQUE) 350 MG/ML injection 75 mL (75 mLs Intravenous Contrast Given 05/03/23 1414)    Mobility walks     Focused Assessments Neuro Assessment Handoff:  Swallow screen pass? Yes    NIH Stroke Scale  Dizziness Present: No Headache Present: No Level of Consciousness (1a.)   : Alert, keenly responsive LOC Questions (1b. )   : Answers one question correctly LOC Commands (1c. )   : Performs both tasks correctly Best Gaze (2. )  : Normal Visual (3. )  : No visual loss Facial Palsy (4. )    : Normal symmetrical movements Motor Arm, Left (5a. )   : No drift Motor Arm, Right (5b. ) : No drift Motor Leg, Left (6a. )  : Drift Motor Leg, Right (6b. ) : No drift Limb Ataxia (7. ): Present in one limb Sensory (8. )  : Mild-to-moderate sensory loss, patient feels pinprick is less sharp or is dull on the affected side, or there is a loss of superficial pain with pinprick, but  patient is aware of being touched Best Language (9. )  : Mild-to-moderate aphasia Dysarthria (10. ): Normal Extinction/Inattention (11.)   : No Abnormality Complete NIHSS TOTAL: 5 Last date known well: 05/02/23 Last time known well: 1130 Neuro Assessment:   Neuro Checks:      Has TPA been given? No If patient is a Neuro Trauma and patient is going to OR before floor call report to 4N Charge nurse: [phone] or [phone]   R Recommendations: See Admitting Provider Note  Report given to:   Additional Notes:

## 2023-05-03 NOTE — ED Provider Notes (Signed)
[employer] EMERGENCY DEPARTMENT AT [facility] Provider Note   CSN: [id number] Arrival date & time: 05/03/23  1334  An emergency department physician performed an initial assessment on this suspected stroke patient at 1334.  History  Chief Complaint  Patient presents with   Code Stroke    Sheila Casey is a 82 y.o. female.  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  She went to bed about 1130 last night and felt her normal self at that time woke up around 2 or 3:00 in the morning and noticed the symptoms went back to bed and it has been persistent throughout the day today.  Patient reports the symptoms in her arm have improved but they are still present in her leg and her speech is still off.  She denies any other complaints at this time.  She does not take any anticoagulation.  The history is provided by the patient and medical records.       Home Medications Prior to Admission medications   Medication Sig Start Date End Date Taking? Authorizing Provider  acetaminophen (TYLENOL) 650 MG CR tablet Take 650-1,300 mg by mouth 2 (two) times daily as needed for pain.   Yes [provider]  amLODipine (NORVASC) 5 MG tablet Take 5 mg by mouth in the morning.   Yes [provider]  ASPIRIN EC PO Take 325 mg by mouth in the morning.   Yes [provider]  Calcium-Magnesium-Zinc (CAL-MAG-ZINC PO) Take 1 tablet by mouth at bedtime.   Yes [provider]  Coenzyme Q10 (CO Q 10) 100 MG CAPS Take 1 capsule by mouth daily. 11/11/22  Yes [provider]  diphenhydramine-acetaminophen (TYLENOL PM) 25-500 MG TABS tablet Take 1-2 tablets by mouth at bedtime as needed (pain).   Yes [provider]   ibuprofen (ADVIL) 200 MG tablet Take 200 mg by mouth 2 (two) times daily as needed (pain.).   Yes [provider]  MAGNESIUM PO Take 1 capsule by mouth at bedtime.   Yes [provider]  REPATHA SURECLICK 140 MG/ML SOAJ Inject 140 mg into the skin See admin instructions. Inject 140mg  into the skin every other Thursday   Yes [provider]  tiZANidine (ZANAFLEX) 2 MG tablet Take 2 mg by mouth every 8 (eight) hours as needed for muscle spasms.   Yes [provider]  Vitamin D, Cholecalciferol, 25 MCG (1000 UT) CAPS Take 1 capsule by mouth daily. 09/24/09  Yes [provider]      Allergies    Atorvastatin, Influenza vaccines, and Sulfonamide derivatives    Review of Systems   Review of Systems  Physical Exam Updated Vital Signs BP (!) 147/93   Pulse (!) 108   Temp 98.2 F (36.8 C) (Oral)   Resp (!) 27   Ht 5\' 3"  (1.6 m)   Wt 66.6 kg   SpO2 98%   BMI 26.01 kg/m  Physical Exam Vitals and nursing note reviewed.  Constitutional:      General: She is not in acute distress.    Appearance: She is well-developed.  HENT:     Head: Normocephalic and atraumatic.  Eyes:     Pupils: Pupils are equal, round, and reactive to light.  Cardiovascular:  Rate and Rhythm: Regular rhythm. Tachycardia present.     Heart sounds: Normal heart sounds. No murmur heard.    No friction rub.  Pulmonary:     Effort: Pulmonary effort is normal.     Breath sounds: Normal breath sounds. No wheezing or rales.  Abdominal:     General: Bowel sounds are normal. There is no distension.     Palpations: Abdomen is soft.     Tenderness: There is no abdominal tenderness. There is no guarding or rebound.  Musculoskeletal:        General: No tenderness. Normal range of motion.     Comments: No edema  Skin:    General: Skin is warm and dry.     Findings: No rash.  Neurological:     Mental Status: She is alert and oriented to person, place, and time.     Cranial  Nerves: Dysarthria present. No cranial nerve deficit or facial asymmetry.     Sensory: Sensory deficit present.     Motor: Pronator drift present.     Comments: Expressive aphasia has some trouble naming objects. Decreased sensation and pronator drift in the LLE.  Psychiatric:        Behavior: Behavior normal.     ED Results / Procedures / Treatments   Labs (all labs ordered are listed, but only abnormal results are displayed) Labs Reviewed  CBC - Abnormal; Notable for the following components:      Result Value   RBC 3.82 (*)    Hemoglobin 11.6 (*)    HCT 35.8 (*)    All other components within normal limits  COMPREHENSIVE METABOLIC PANEL - Abnormal; Notable for the following components:   Glucose, Bld 104 (*)    All other components within normal limits  URINALYSIS, ROUTINE W REFLEX MICROSCOPIC - Abnormal; Notable for the following components:   Color, Urine STRAW (*)    All other components within normal limits  I-STAT CHEM 8, ED - Abnormal; Notable for the following components:   Glucose, Bld 100 (*)    TCO2 21 (*)    All other components within normal limits  CBG MONITORING, ED - Abnormal; Notable for the following components:   Glucose-Capillary 105 (*)    All other components within normal limits  PROTIME-INR  APTT  DIFFERENTIAL  ETHANOL  HEMOGLOBIN A1C  LIPID PANEL  TSH    EKG EKG Interpretation Date/Time:  Monday May 03 2023 13:56:19 EDT Ventricular Rate:  105 PR Interval:  160 QRS Duration:  70 QT Interval:  322 QTC Calculation: 426 R Axis:   -10  Text Interpretation: Sinus tachycardia Inferior infarct, old No significant change since last tracing Confirmed by Gwyneth Sprout ([id number]) on 05/03/2023 2:17:36 PM  Radiology CT ANGIO HEAD NECK W WO CM (CODE STROKE)  Result Date: 05/03/2023 CLINICAL DATA:  Neuro deficit, acute, stroke suspected EXAM: CT ANGIOGRAPHY HEAD AND NECK WITH AND WITHOUT CONTRAST TECHNIQUE: Multidetector CT imaging of the head  and neck was performed using the standard protocol during bolus administration of intravenous contrast. Multiplanar CT image reconstructions and MIPs were obtained to evaluate the vascular anatomy. Carotid stenosis measurements (when applicable) are obtained utilizing NASCET criteria, using the distal internal carotid diameter as the denominator. RADIATION DOSE REDUCTION: This exam was performed according to the departmental dose-optimization program which includes automated exposure control, adjustment of the mA and/or kV according to patient size and/or use of iterative reconstruction technique. CONTRAST:  75mL OMNIPAQUE IOHEXOL 350 MG/ML SOLN COMPARISON:  None  Available. FINDINGS: CT HEAD FINDINGS See same day CT head for intracranial findings CTA NECK FINDINGS Aortic arch: Standard branching. Imaged portion shows no evidence of aneurysm or dissection. No significant stenosis of the major arch vessel origins. Right carotid system: No evidence of dissection, stenosis (50% or greater), or occlusion. Left carotid system: No evidence of dissection, stenosis (50% or greater), or occlusion. There is mild focal narrowing in the proximal left ICA secondary to soft atherosclerotic plaque. Vertebral arteries: Left dominant. No evidence of dissection, stenosis (50% or greater), or occlusion. Skeleton: Negative. Grade 1 anterolisthesis of C4 on C5 and trace retrolisthesis of C5 on C6. Other neck: Negative. Upper chest: Negative. Review of the MIP images confirms the above findings CTA HEAD FINDINGS Anterior circulation: No significant stenosis, proximal occlusion, aneurysm, or vascular malformation. Posterior circulation: No significant stenosis, proximal occlusion, aneurysm, or vascular malformation. Venous sinuses: As permitted by contrast timing, patent. Anatomic variants: None Review of the MIP images confirms the above findings IMPRESSION: 1. No intracranial large vessel occlusion or significant stenosis. 2. No  hemodynamically significant stenosis in the neck. 3. See same day CT head for intracranial findings. Electronically Signed   By: Lorenza Cambridge M.D.   On: 05/03/2023 14:29   CT HEAD CODE STROKE WO CONTRAST  Result Date: 05/03/2023 CLINICAL DATA:  Code stroke.  Left-sided weakness EXAM: CT HEAD WITHOUT CONTRAST TECHNIQUE: Contiguous axial images were obtained from the base of the skull through the vertex without intravenous contrast. RADIATION DOSE REDUCTION: This exam was performed according to the departmental dose-optimization program which includes automated exposure control, adjustment of the mA and/or kV according to patient size and/or use of iterative reconstruction technique. COMPARISON:  Brain MR 05/11/16 FINDINGS: Brain: No hemorrhage. No hydrocephalus. No extra-axial fluid collection. There is an age indeterminate, but likely a chronic infarct in the left parietal lobe. Chronic left cerebellar infarct. No mass effect. No mass lesion. Mineralization of the basal ganglia bilaterally. Vascular: No hyperdense vessel or unexpected calcification. Skull: Normal. Negative for fracture or focal lesion. Dermal calcifications are present. Sinuses/Orbits: Middle ear or mastoid. Paranasal sinuses are clear. Orbits are unremarkable. Other: None. ASPECTS [facility] Stroke Program Early CT Score): 10. Given patient's symptoms, the age indeterminate infarct in the left parietal lobe is likely chronic. IMPRESSION: 1.  No hemorrhage or CT evidence of a right sided cortical infarct. 2. 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 Findings were paged to Dr. Derry Lory on 05/03/23 at 1:55 PM via [facility] paging system. Electronically Signed   By: Lorenza Cambridge M.D.   On: 05/03/2023 13:55    Procedures Procedures    Medications Ordered in ED Medications  sodium chloride flush (NS) 0.9 % injection 3 mL (3 mLs Intravenous Not Given 05/03/23 1359)   stroke: early stages of recovery book (has no  administration in time range)  0.9 %  sodium chloride infusion (has no administration in time range)  acetaminophen (TYLENOL) tablet 650 mg (has no administration in time range)    Or  acetaminophen (TYLENOL) 160 MG/5ML solution 650 mg (has no administration in time range)    Or  acetaminophen (TYLENOL) suppository 650 mg (has no administration in time range)  senna-docusate (Senokot-S) tablet 1 tablet (has no administration in time range)  enoxaparin (LOVENOX) injection 40 mg (has no administration in time range)  aspirin EC tablet 81 mg (has no administration in time range)  iohexol (OMNIPAQUE) 350 MG/ML injection 75 mL (75 mLs Intravenous Contrast Given 05/03/23 1414)  ED Course/ Medical Decision Making/ A&P                                 Medical Decision Making Amount and/or Complexity of Data Reviewed Independent Historian: EMS External Data Reviewed: notes. Labs: ordered. Decision-making details documented in ED Course. Radiology: ordered and independent interpretation performed. Decision-making details documented in ED Course. ECG/medicine tests: ordered and independent interpretation performed. Decision-making details documented in ED Course.  Risk Decision regarding hospitalization.   Pt with multiple medical problems and comorbidities and presenting today with a complaint that caries a high risk for morbidity and mortality.  Here today initially as a code stroke.  Patient was last normal at 1130 last night when she went to bed.  She woke up in the middle the night maybe around 2 or 3 in the morning and noticed numbness and weakness in the left arm and leg.  When she woke up this morning it was still present and she noticed some issues with speech.  She reports the left upper extremity is improved but the other symptoms are still persistent.  Patient was made a code stroke as symptoms have been less than 24 hours and she did have speech component as well as upper and lower  extremity involvement.  NIH of 4 currently on exam.  Neurology present at bedside upon patient's arrival.  Patient is not a candidate for [facility] as she is outside of the 4-hour window.  Patient has no evidence of large vessel occlusion on her CT and symptoms are mild to where she is not a candidate for interventional radiology.  I have independently visualized and interpreted pt's images today.  CT of the head without evidence of acute bleed.  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  I independently interpreted patient's labs and EKG.  CBC, CMP, EtOH and coags all without acute findings.  EKG today with sinus tachycardia but no other acute findings.  Patient will need admission, MRI for further stroke workup.  Consulted hospitalist for admission.          Final Clinical Impression(s) / ED Diagnoses Final diagnoses:  Cerebrovascular accident (CVA), unspecified mechanism (HCC)    Rx / DC Orders ED Discharge Orders     None         Gwyneth Sprout, MD 05/03/23 1541

## 2023-05-03 NOTE — H&P (Signed)
[facility] History and Physical   Patient: Sheila Casey JXB:[id number]   PCP: Creola Corn, MD DOB: Feb 04, 1941   DOA: 05/03/2023   DOS: 05/03/2023   DOS: the patient was seen and examined on 05/03/2023  Patient coming from: The patient is coming from Home  Chief Complaint: Left-sided weakness and numbness  HPI: CORBY VILLASENOR is a 82 y.o. female with Past medical history of TIA, hypertension, lumbar laminectomy 2 and half weeks ago as reviewed from EMR, presented at [facility], ED with complaining of left-sided weakness and numbness started last night around 11 PM.  Patient was able to go to the bathroom and feeling left-sided numbness and weakness, she thought it is possible that she laid on her back for prolonged time after surgery and she woke up around 6:30 AM and still had same feelings.  Called her surgeon to discuss left-sided numbness and weakness and she was recommended to come to the ED for stroke workup, it is less likely due to surgery of lower spine causing symptoms on left side including left upper extremity.  Patient also had expressive aphasia.  Patient was seen as a code stroke in the ED, NIH 4, out of window for tPA and workup negative for large vessel occlusion, so not a candidate for IR intervention.  Patient was seen by neurologist, recommended to admit for stroke workup. TRH consulted for admission and further management as below  ED Course: VS sinus tachycardia heart rate 116, RR 11-27, BP 158/98, saturating 98% on room air CBC and BMP unremarkable  CT head Code stroke: 1.  No hemorrhage or CT evidence of a right sided cortical infarct.  2. Age indeterminate, but likely chronic left frontoparietal region Infarct.  CTA Head/Neck: 1. No intracranial large vessel occlusion or significant stenosis. 2. No hemodynamically significant stenosis in the neck. 3. See same day CT head for intracranial findings.   Review of Systems: as mentioned in the history of present  illness.  All other systems reviewed and are negative.  Past Medical History:  Diagnosis Date   Bulimia    resolved in 1985   Diverticulosis    Guillain Barr syndrome (HCC) 1970   Hyperlipidemia    Hypertension    Internal hemorrhoids    Shingles    Stroke [facility])    Substance abuse (HCC)    ETOH, coccaine, and Speed user, quit in 1987   TIA (transient ischemic attack) 04/2016   Tubular adenoma of colon    Past Surgical History:  Procedure Laterality Date   APPENDECTOMY  1966   COLONOSCOPY     LUMBAR LAMINECTOMY/DECOMPRESSION MICRODISCECTOMY Left 04/16/2023   Procedure: Laminectomy and Foraminotomy - Lumbar Three-Four/Lumbar Four- Lumbar Five- Left, sublaminar decompression;  Surgeon: Arman Bogus, MD;  [facility];  Service: Neurosurgery;  Laterality: Left;  3C   TONSILLECTOMY     TUBAL LIGATION  1981   Social History:  reports that she has quit smoking. She has never used smokeless tobacco. She reports that she does not drink alcohol and does not use drugs.  Allergies  Allergen Reactions   Atorvastatin Other (See Comments)    myalgias   Influenza Vaccines Other (See Comments)   Sulfonamide Derivatives Anxiety    hyper active    Family history reviewed and not pertinent Family History  Problem Relation Age of Onset   Hypertension Mother    Other Mother        prolapsed bladder   Colon cancer Father  passed away at age 67 with this   Lung cancer Sister        stage 4   Other Child 15       MVA   Stroke Neg Hx      Prior to Admission medications   Medication Sig Start Date End Date Taking? Authorizing Provider  acetaminophen (TYLENOL) 650 MG CR tablet Take 650-1,300 mg by mouth 2 (two) times daily as needed for pain.   Yes [provider]  amLODipine (NORVASC) 5 MG tablet Take 5 mg by mouth in the morning.   Yes [provider]  ASPIRIN EC PO Take 325 mg by mouth in the morning.   Yes [provider]   Calcium-Magnesium-Zinc (CAL-MAG-ZINC PO) Take 1 tablet by mouth at bedtime.   Yes [provider]  Coenzyme Q10 (CO Q 10) 100 MG CAPS Take 1 capsule by mouth daily. 11/11/22  Yes [provider]  diphenhydramine-acetaminophen (TYLENOL PM) 25-500 MG TABS tablet Take 1-2 tablets by mouth at bedtime as needed (pain).   Yes [provider]  ibuprofen (ADVIL) 200 MG tablet Take 200 mg by mouth 2 (two) times daily as needed (pain.).   Yes [provider]  MAGNESIUM PO Take 1 capsule by mouth at bedtime.   Yes [provider]  REPATHA SURECLICK 140 MG/ML SOAJ Inject 140 mg into the skin See admin instructions. Inject 140mg  into the skin every other Thursday   Yes [provider]  tiZANidine (ZANAFLEX) 2 MG tablet Take 2 mg by mouth every 8 (eight) hours as needed for muscle spasms.   Yes [provider]  Vitamin D, Cholecalciferol, 25 MCG (1000 UT) CAPS Take 1 capsule by mouth daily. 09/24/09  Yes [provider]    Physical Exam: Vitals:   05/03/23 1355 05/03/23 1358 05/03/23 1430 05/03/23 1500  BP: (!) 153/71  (!) 153/92 (!) 147/93  Pulse: (!) 108  (!) 106 (!) 108  Resp: 11  (!) 25 (!) 27  Temp:  98.2 F (36.8 C)    TempSrc:  Oral    SpO2: 98%  97% 98%  Weight:      Height:        General: alert and oriented to time, place, and person. Appear in mild distress, affect appropriate Eyes: PERRLA, Conjunctiva normal ENT: Oral Mucosa Clear, moist  Neck: no JVD, no Abnormal Mass Or lumps Cardiovascular: S1 and S2 Present, no Murmur, peripheral pulses symmetrical Respiratory: good respiratory effort, Bilateral Air entry equal and Decreased, no signs of accessory muscle use, Clear to Auscultation, no Crackles, no wheezes Abdomen: Bowel Sound present, Soft and no tenderness, no hernia Skin: no rashes  Extremities: no Pedal edema, no calf tenderness Neurologic: Mild numbness on the left upper and lower extremity, no focal  deficit, power 5/5 bilaterally.  CN grossly intact, no dysarthria or aphasia.   Gait not checked due to patient safety concerns  Data Reviewed: I have personally reviewed and interpreted labs, imaging as discussed below.  CBC: Recent Labs  Lab 05/03/23 1340 05/03/23 1343  WBC 8.9  --   NEUTROABS 6.2  --   HGB 11.6* 12.2  HCT 35.8* 36.0  MCV 93.7  --   PLT 338  --    Basic Metabolic Panel: Recent Labs  Lab 05/03/23 1340 05/03/23 1343  NA 138 140  K 4.1 4.2  CL 105 107  CO2 22  --   GLUCOSE 104* 100*  BUN 17 19  CREATININE 0.93 0.90  CALCIUM 9.3  --    GFR: Estimated Creatinine Clearance: 45.7 mL/min (by C-G formula based on SCr of 0.9 mg/dL). Liver Function Tests: Recent Labs  Lab 05/03/23 1340  AST 16  ALT 16  ALKPHOS 69  BILITOT 0.4  PROT 6.8  ALBUMIN 3.9   No results for input(s): "LIPASE", "AMYLASE" in the last 168 hours. No results for input(s): "AMMONIA" in the last 168 hours. Coagulation Profile: Recent Labs  Lab 05/03/23 1340  INR 0.9   Cardiac Enzymes: No results for input(s): "CKTOTAL", "CKMB", "CKMBINDEX", "TROPONINI" in the last 168 hours. BNP (last 3 results) No results for input(s): "PROBNP" in the last 8760 hours. HbA1C: No results for input(s): "HGBA1C" in the last 72 hours. CBG: Recent Labs  Lab 05/03/23 1337  GLUCAP 105*   Lipid Profile: No results for input(s): "CHOL", "HDL", "LDLCALC", "TRIG", "CHOLHDL", "LDLDIRECT" in the last 72 hours. Thyroid Function Tests: No results for input(s): "TSH", "T4TOTAL", "FREET4", "T3FREE", "THYROIDAB" in the last 72 hours. Anemia Panel: No results for input(s): "VITAMINB12", "FOLATE", "FERRITIN", "TIBC", "IRON", "RETICCTPCT" in the last 72 hours. Urine analysis:    Component Value Date/Time   COLORURINE STRAW (A) 05/03/2023 1500   APPEARANCEUR CLEAR 05/03/2023 1500   LABSPEC 1.020 05/03/2023 1500   PHURINE 7.0 05/03/2023 1500   GLUCOSEU NEGATIVE 05/03/2023 1500   HGBUR NEGATIVE  05/03/2023 1500   BILIRUBINUR NEGATIVE 05/03/2023 1500   KETONESUR NEGATIVE 05/03/2023 1500   PROTEINUR NEGATIVE 05/03/2023 1500   NITRITE NEGATIVE 05/03/2023 1500   LEUKOCYTESUR NEGATIVE 05/03/2023 1500    Radiological Exams on Admission: CT ANGIO HEAD NECK W WO CM (CODE STROKE)  Result Date: 05/03/2023 CLINICAL DATA:  Neuro deficit, acute, stroke suspected EXAM: CT ANGIOGRAPHY HEAD AND NECK WITH AND WITHOUT CONTRAST TECHNIQUE: Multidetector CT imaging of the head and neck was performed using the standard protocol during bolus administration of intravenous contrast. Multiplanar CT image reconstructions and MIPs were obtained to evaluate the vascular anatomy. Carotid stenosis measurements (when applicable) are obtained utilizing NASCET criteria, using the distal internal carotid diameter as the denominator. RADIATION DOSE REDUCTION: This exam was performed according to the departmental dose-optimization program which includes automated exposure control, adjustment of the mA and/or kV according to patient size and/or use of iterative reconstruction technique. CONTRAST:  75mL OMNIPAQUE IOHEXOL 350 MG/ML SOLN COMPARISON:  None Available. FINDINGS: CT HEAD FINDINGS See same day CT head for intracranial findings CTA NECK FINDINGS Aortic arch: Standard branching. Imaged portion shows no evidence of aneurysm or dissection. No significant stenosis of the major arch vessel origins. Right carotid system: No evidence of dissection, stenosis (50% or greater), or occlusion. Left carotid system: No evidence of dissection, stenosis (50% or greater), or occlusion. There is mild focal narrowing in the proximal left ICA secondary to soft atherosclerotic plaque. Vertebral arteries: Left dominant. No evidence of dissection, stenosis (50% or greater), or occlusion. Skeleton: Negative. Grade 1 anterolisthesis of C4 on C5 and trace retrolisthesis of C5 on C6. Other neck: Negative. Upper chest: Negative. Review of the MIP images  confirms the above findings CTA HEAD FINDINGS Anterior circulation: No significant stenosis, proximal occlusion, aneurysm, or vascular malformation. Posterior circulation: No significant stenosis, proximal occlusion, aneurysm, or vascular malformation. Venous sinuses: As permitted by contrast timing, patent. Anatomic variants: None Review of the MIP images confirms the above findings IMPRESSION: 1. No intracranial large vessel occlusion or significant stenosis. 2. No hemodynamically significant stenosis in the neck. 3. See same day CT head for intracranial findings. Electronically Signed  By: Lorenza Cambridge M.D.   On: 05/03/2023 14:29   CT HEAD CODE STROKE WO CONTRAST  Result Date: 05/03/2023 CLINICAL DATA:  Code stroke.  Left-sided weakness EXAM: CT HEAD WITHOUT CONTRAST TECHNIQUE: Contiguous axial images were obtained from the base of the skull through the vertex without intravenous contrast. RADIATION DOSE REDUCTION: This exam was performed according to the departmental dose-optimization program which includes automated exposure control, adjustment of the mA and/or kV according to patient size and/or use of iterative reconstruction technique. COMPARISON:  Brain MR 05/11/16 FINDINGS: Brain: No hemorrhage. No hydrocephalus. No extra-axial fluid collection. There is an age indeterminate, but likely a chronic infarct in the left parietal lobe. Chronic left cerebellar infarct. No mass effect. No mass lesion. Mineralization of the basal ganglia bilaterally. Vascular: No hyperdense vessel or unexpected calcification. Skull: Normal. Negative for fracture or focal lesion. Dermal calcifications are present. Sinuses/Orbits: Middle ear or mastoid. Paranasal sinuses are clear. Orbits are unremarkable. Other: None. ASPECTS [facility] Stroke Program Early CT Score): 10. Given patient's symptoms, the age indeterminate infarct in the left parietal lobe is likely chronic. IMPRESSION: 1.  No hemorrhage or CT evidence of a right  sided cortical infarct. 2. 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 Findings were paged to Dr. Derry Lory on 05/03/23 at 1:55 PM via [facility] paging system. Electronically Signed   By: Lorenza Cambridge M.D.   On: 05/03/2023 13:55   EKG: Independently reviewed. sinus tachycardia. Echocardiogram: Pending  I reviewed all nursing notes, pharmacy notes, vitals, pertinent old records.  Assessment/Plan Principal Problem:   Stroke-like symptom   Stroke-like symptom Patient presented with left-sided weakness and numbness, currently feels numb, mild weakness but she is ambulatory CT head code stroke negative, CT angio negative for large vessel occlusion monitor on telemetry Neurocheck as per protocol Follow lipid profile, A1c and TSH Follow MRI brain and TTE Started aspirin 81 mg p.o. daily Follow neurology for further recommendation Follow PT and OT and SLP eval as per protocol   Hypertension Allow permissive hypertension due to suspected CVA/TIA Held home medications for now Monitor BP and titrate medications accordingly   Back pain s/p laminectomy Continue as needed pain medications  Nutrition: Cardiac diet once cleared by SLP DVT Prophylaxis: Subcutaneous Lovenox  Advance goals of care discussion: Full code   Consults: Neurology  Family Communication: family was present at bedside, at the time of interview.  Opportunity was given to ask question and all questions were answered satisfactorily.  Disposition: Admitted as observation, telemetry unit. Likely to be discharged home, in 1-2 days.  I have discussed plan of care as described above with RN and patient/family.  Severity of Illness: The appropriate patient status for this patient is OBSERVATION. Observation status is judged to be reasonable and necessary in order to provide the required intensity of service to ensure the patient's safety. The patient's presenting symptoms, physical exam findings,  and initial radiographic and laboratory data in the context of their medical condition is felt to place them at decreased risk for further clinical deterioration. Furthermore, it is anticipated that the patient will be medically stable for discharge from the hospital within 2 midnights of admission.    Author: Gillis Santa, MD [facility] 05/03/2023 3:32 PM   To reach On-call, see care teams to locate the attending and reach out to them via [email or web address]. If 7PM-7AM, please contact night-coverage If you still have difficulty reaching the attending provider, please page the [facility] (Director on Call) for [facility] on  amion for assistance.

## 2023-05-03 NOTE — Code Documentation (Signed)
Stroke Response Nurse Documentation Code Documentation  VENNESSA AFFINITO is a 82 y.o. female arriving to [facility]  via [facility] on 05-03-2023 with past medical hx of TIA, HTN, recent back surgery. On No antithrombotic. Code stroke was activated by EMS.   Patient from home where she was LKW at 2330 last night and now complaining of Left side weakness and numbness.  She states that she had back surgery 2 weeks ago.  This morning when she woke up about 0300 she noticed left arm and leg weakness and some numbness.    Stroke team at the bedside on patient arrival. Labs drawn and patient cleared for CT by Dr. Anitra Lauth. Patient to CT with team. NIHSS 5, see documentation for details and code stroke times. Patient with disoriented, left leg weakness, left limb ataxia, left decreased sensation, and Expressive aphasia  on exam. The following imaging was completed:  CT Head and CTA. Patient is not a candidate for IV Thrombolytic due to LKW outside TNK window. Patient is not a candidate for IR due to no LVO on CTA.   Care Plan: VS and NIHSS q 2 hours x 12 hours.   Bedside handoff with ED RN Katrina.    Marcellina Millin  Stroke Response RN

## 2023-05-03 NOTE — ED Triage Notes (Signed)
Code stroke. LKW at 2300. Came in for aphasia and left sided weakness and numbness. A&Ox4.

## 2023-05-04 ENCOUNTER — Other Ambulatory Visit (HOSPITAL_COMMUNITY): Payer: Self-pay

## 2023-05-04 ENCOUNTER — Observation Stay (HOSPITAL_BASED_OUTPATIENT_CLINIC_OR_DEPARTMENT_OTHER): Payer: Medicare Other

## 2023-05-04 DIAGNOSIS — I6389 Other cerebral infarction: Secondary | ICD-10-CM

## 2023-05-04 DIAGNOSIS — R2990 Unspecified symptoms and signs involving the nervous system: Secondary | ICD-10-CM | POA: Diagnosis not present

## 2023-05-04 LAB — ECHOCARDIOGRAM COMPLETE
AR max vel: 2.32 cm2
AV Area VTI: 2.44 cm2
AV Area mean vel: 2.24 cm2
AV Mean grad: 2.7 mm[Hg]
AV Peak grad: 4.8 mm[Hg]
Ao pk vel: 1.1 m/s
Area-P 1/2: 4.68 cm2
Height: 63 in
P 1/2 time: 408 ms
S' Lateral: 2.4 cm
Weight: 2349.22 [oz_av]

## 2023-05-04 LAB — CBC
HCT: 32 % — ABNORMAL LOW (ref 36.0–46.0)
Hemoglobin: 10.5 g/dL — ABNORMAL LOW (ref 12.0–15.0)
MCH: 30.2 pg (ref 26.0–34.0)
MCHC: 32.8 g/dL (ref 30.0–36.0)
MCV: 92 fL (ref 80.0–100.0)
Platelets: 317 10*3/uL (ref 150–400)
RBC: 3.48 MIL/uL — ABNORMAL LOW (ref 3.87–5.11)
RDW: 13 % (ref 11.5–15.5)
WBC: 6.4 10*3/uL (ref 4.0–10.5)
nRBC: 0 % (ref 0.0–0.2)

## 2023-05-04 LAB — BASIC METABOLIC PANEL
Anion gap: 8 (ref 5–15)
BUN: 13 mg/dL (ref 8–23)
CO2: 23 mmol/L (ref 22–32)
Calcium: 9.1 mg/dL (ref 8.9–10.3)
Chloride: 109 mmol/L (ref 98–111)
Creatinine, Ser: 0.73 mg/dL (ref 0.44–1.00)
GFR, Estimated: >60 mL/min (ref >60)
Glucose, Bld: 103 mg/dL — ABNORMAL HIGH (ref 70–99)
Potassium: 3.8 mmol/L (ref 3.5–5.1)
Sodium: 140 mmol/L (ref 135–145)

## 2023-05-04 LAB — MAGNESIUM: Magnesium: 2.1 mg/dL (ref 1.7–2.4)

## 2023-05-04 LAB — PHOSPHORUS: Phosphorus: 3.4 mg/dL (ref 2.5–4.6)

## 2023-05-04 MED ORDER — CLOPIDOGREL BISULFATE 75 MG PO TABS
75.0000 mg | ORAL_TABLET | Freq: Every day | ORAL | 2 refills | Status: AC
  Filled 2023-05-04: qty 30, 30d supply, fill #0

## 2023-05-04 MED ORDER — ASPIRIN 81 MG PO TBEC
81.0000 mg | DELAYED_RELEASE_TABLET | Freq: Every day | ORAL | 0 refills | Status: AC
  Filled 2023-05-04: qty 21, 21d supply, fill #0

## 2023-05-04 MED ORDER — CLOPIDOGREL BISULFATE 75 MG PO TABS
75.0000 mg | ORAL_TABLET | Freq: Every day | ORAL | Status: DC
  Administered 2023-05-04: 75 mg via ORAL
  Filled 2023-05-04: qty 1

## 2023-05-04 NOTE — TOC Transition Note (Signed)
Transition of Care [facility]) - CM/SW Discharge Note   Patient Details  Name: GIANNI FUCHS MRN: [id number] Date of Birth: [birth year]  Transition of Care [facility]) CM/SW Contact:  Kermit Balo, RN Phone Number: 05/04/2023, 2:25 PM   Clinical Narrative:     Patient is from home with her spouse. Her spouse works during the daytime but is home weeknights and weekends.  No DME at home.  Pt drives self.  She manages her own medications and denies any issues.  Outpatient arranged with [facility] re[facility]. Pt has attended there before and asked to attend there again. Information on the AVS.  Spouse will transport home.  Final next level of care: OP Rehab Barriers to Discharge: No Barriers Identified   Patient Goals and CMS Choice   Choice offered to / list presented to : Patient  Discharge Placement                         Discharge Plan and Services Additional resources added to the After Visit Summary for                                       Social Determinants of Health (SDOH) Interventions SDOH Screenings   Food Insecurity: No Food Insecurity (05/03/2023)  Housing: Low Risk  (05/03/2023)  Transportation Needs: No Transportation Needs (05/03/2023)  Utilities: Not At Risk (05/03/2023)  Tobacco Use: Medium Risk (05/03/2023)     Readmission Risk Interventions     No data to display

## 2023-05-04 NOTE — Evaluation (Signed)
Speech Language Pathology Evaluation Patient Details Name: Sheila Casey MRN: [id number] DOB: 1941-01-29 Today's Date: 05/04/2023 Time: 4098-1191 SLP Time Calculation (min) (ACUTE ONLY): 27 min  Problem List:  Patient Active Problem List   Diagnosis Date Noted   Stroke-like symptom 05/03/2023   TIA (transient ischemic attack) 05/11/2016   Transient ischemic attack (TIA) 05/11/2016   Past Medical History:  Past Medical History:  Diagnosis Date   Bulimia    resolved in 1985   Diverticulosis    Guillain Barr syndrome (HCC) 1970   Hyperlipidemia    Hypertension    Internal hemorrhoids    Shingles    Stroke [facility])    Substance abuse (HCC)    ETOH, coccaine, and Speed user, quit in 1987   TIA (transient ischemic attack) 04/2016   Tubular adenoma of colon    Past Surgical History:  Past Surgical History:  Procedure Laterality Date   APPENDECTOMY  1966   COLONOSCOPY     LUMBAR LAMINECTOMY/DECOMPRESSION MICRODISCECTOMY Left 04/16/2023   Procedure: Laminectomy and Foraminotomy - Lumbar Three-Four/Lumbar Four- Lumbar Five- Left, sublaminar decompression;  Surgeon: Arman Bogus, MD;  Location: [facility] OR;  Service: Neurosurgery;  Laterality: Left;  3C   TONSILLECTOMY     TUBAL LIGATION  1981   HPI:  82 yo female adm to [facility] with left sided numbness and weakness.  Underwent recent lumbar surgery.  TIA, hypertension, lumbar laminectomy 2 and half weeks ago.  Patient was able to go to the bathroom and feeling left-sided numbness and weakness. 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 Patient PMH + for Guillian Barre in 1970, ETOH use, shingles, TIA.  Pt is married and resides with her spouse. Speech eval ordered.   Assessment / Plan / Recommendation Clinical Impression  SLUMS *[employer] Mental Status exam* administered with pt scoring 19/20. Strengths noted in orientation,  visuospatial skills, and recall with category cue *x2.  Difficulties noted in areas of memory of narrative information, complex problem solving, and working memory.  Her speech, receptive and expressive language are fluent and clear.  SLP advised her to results of her screen and encouraged her to assure her spouse is assisting with managing medication, appointments, finances, etc- to which she agreed. Pt admits to increased difficulties with performing tasks on this screen.   No SLP follow up indicated as pt appears to have support needed at home *spouse still works.    SLP Assessment  SLP Recommendation/Assessment: Patient does not need any further Speech Lanaguage Pathology Services SLP Visit Diagnosis: Cognitive communication deficit (R41.841)    Recommendations for follow up therapy are one component of a multi-disciplinary discharge planning process, led by the attending physician.  Recommendations may be updated based on patient status, additional functional criteria and insurance authorization.    Follow Up Recommendations  No SLP follow up    Assistance Recommended at Discharge   Intermittent assist  Functional Status Assessment Patient has had a recent decline in their functional status and/or demonstrates limited ability to make significant improvements in function in a reasonable and predictable amount of time  Frequency and Duration           SLP Evaluation Cognition  Overall Cognitive Status: No family/caregiver present to determine baseline cognitive functioning Arousal/Alertness: Awake/alert Orientation Level: Oriented X4 Year: 2024 Month: October Day of Week: Correct Attention: Sustained Sustained Attention: Impaired Sustained Attention Impairment:  Verbal complex Memory: Impaired Memory Impairment: Retrieval deficit Problem Solving: Impaired Problem Solving Impairment: Verbal complex       Comprehension  Auditory Comprehension Overall Auditory Comprehension:  Appears within functional limits for tasks assessed Yes/No Questions: Within Functional Limits Commands: Within Functional Limits Interfering Components: Attention;Working memory;Processing speed EffectiveTechniques: Dietitian: Not tested Reading Comprehension Reading Status: Not tested    Expression Expression Primary Mode of Expression: Verbal Verbal Expression Overall Verbal Expression: Appears within functional limits for tasks assessed Initiation: No impairment Level of Generative/Spontaneous Verbalization: Conversation Repetition: No impairment Naming: Not tested Pragmatics: No impairment Written Expression Dominant Hand: Right Written Expression:  (drew clock, WFL)   Oral / Motor  Oral Motor/Sensory Function Overall Oral Motor/Sensory Function: Within functional limits Motor Speech Overall Motor Speech: Appears within functional limits for tasks assessed Respiration: Within functional limits Phonation: Normal Resonance: Within functional limits Articulation: Within functional limitis Intelligibility: Intelligible Motor Planning: Witnin functional limits Motor Speech Errors: Not applicable            Chales Abrahams 05/04/2023, 8:14 AM Rolena Infante, MS [facility] SLP Acute Rehab Services Office [phone]

## 2023-05-04 NOTE — Progress Notes (Addendum)
STROKE TEAM PROGRESS NOTE   BRIEF HPI Sheila Casey is a 82 y.o. female with PMH significant for TIA October 2017 and 2023, recent back sx (2 weeks ago), HLD, HTN, GBS (at 82 years old), Shingles,. Her neurologic examination is notable for left leg weakness, left arm and leg numbness (worse in left leg), left leg ataxia and aphasia.  NIH: 4. She woke up at 0300 and began experiencing these symptoms after going to bed around 2330 last night. She is out of the window for TNK, along with the recent surgery serving as an exclusion. CT and CTA negative. Admitted for stroke work up.    SIGNIFICANT HOSPITAL EVENTS MRI positive for R thalamic infarct  INTERIM HISTORY/SUBJECTIVE On exam, patient has improved sensation in left arm. No focal weakness or confusion.   Pending ECHO for full stroke work up   OBJECTIVE  CBC    Component Value Date/Time   WBC 6.4 05/04/2023 0547   RBC 3.48 (L) 05/04/2023 0547   HGB 10.5 (L) 05/04/2023 0547   HCT 32.0 (L) 05/04/2023 0547   PLT 317 05/04/2023 0547   MCV 92.0 05/04/2023 0547   MCH 30.2 05/04/2023 0547   MCHC 32.8 05/04/2023 0547   RDW 13.0 05/04/2023 0547   LYMPHSABS 2.1 05/03/2023 1340   MONOABS 0.6 05/03/2023 1340   EOSABS 0.0 05/03/2023 1340   BASOSABS 0.0 05/03/2023 1340    BMET    Component Value Date/Time   NA 140 05/04/2023 0547   K 3.8 05/04/2023 0547   CL 109 05/04/2023 0547   CO2 23 05/04/2023 0547   GLUCOSE 103 (H) 05/04/2023 0547   BUN 13 05/04/2023 0547   CREATININE 0.73 05/04/2023 0547   CALCIUM 9.1 05/04/2023 0547   GFRNONAA >60 05/04/2023 0547    IMAGING past 24 hours MR BRAIN WO CONTRAST  Result Date: 05/03/2023 CLINICAL DATA:  Neuro deficit, acute, stroke suspected. Left-sided weakness. EXAM: MRI HEAD WITHOUT CONTRAST TECHNIQUE: Multiplanar, multiecho pulse sequences of the brain and surrounding structures were obtained without intravenous contrast. COMPARISON:  CTA head/neck 05/03/2023. FINDINGS: Brain: Acute  perforator infarct in the right thalamus. No acute hemorrhage or significant mass effect. Encephalomalacia from prior infarct along the left supramarginal gyrus. 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 No hydrocephalus or extra-axial collection. No foci of abnormal susceptibility. Vascular: Normal flow voids. Skull and upper cervical spine: Normal marrow signal. Sinuses/Orbits: No acute findings. Other: None. 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 No acute hemorrhage or significant mass effect. Electronically Signed   By: Orvan Falconer M.D.   On: 05/03/2023 19:50   CT ANGIO HEAD NECK W WO CM (CODE STROKE)  Result Date: 05/03/2023 CLINICAL DATA:  Neuro deficit, acute, stroke suspected EXAM: CT ANGIOGRAPHY HEAD AND NECK WITH AND WITHOUT CONTRAST TECHNIQUE: Multidetector CT imaging of the head and neck was performed using the standard protocol during bolus administration of intravenous contrast. Multiplanar CT image reconstructions and MIPs were obtained to evaluate the vascular anatomy. Carotid stenosis measurements (when applicable) are obtained utilizing NASCET criteria, using the distal internal carotid diameter as the denominator. RADIATION DOSE REDUCTION: This exam was performed according to the departmental dose-optimization program which includes automated exposure control, adjustment of the mA and/or kV according to patient size and/or use of iterative reconstruction technique. CONTRAST:  75mL OMNIPAQUE IOHEXOL 350 MG/ML SOLN COMPARISON:  None Available. FINDINGS: CT HEAD FINDINGS See same day CT head for intracranial findings CTA NECK FINDINGS  Aortic arch: Standard branching. Imaged portion shows no evidence of aneurysm or dissection. No significant stenosis of the major arch vessel origins. Right carotid system: No evidence of dissection, stenosis (50% or greater), or occlusion. Left carotid system: No evidence of  dissection, stenosis (50% or greater), or occlusion. There is mild focal narrowing in the proximal left ICA secondary to soft atherosclerotic plaque. Vertebral arteries: Left dominant. No evidence of dissection, stenosis (50% or greater), or occlusion. Skeleton: Negative. Grade 1 anterolisthesis of C4 on C5 and trace retrolisthesis of C5 on C6. Other neck: Negative. Upper chest: Negative. Review of the MIP images confirms the above findings CTA HEAD FINDINGS Anterior circulation: No significant stenosis, proximal occlusion, aneurysm, or vascular malformation. Posterior circulation: No significant stenosis, proximal occlusion, aneurysm, or vascular malformation. Venous sinuses: As permitted by contrast timing, patent. Anatomic variants: None Review of the MIP images confirms the above findings IMPRESSION: 1. No intracranial large vessel occlusion or significant stenosis. 2. No hemodynamically significant stenosis in the neck. 3. See same day CT head for intracranial findings. Electronically Signed   By: Lorenza Cambridge M.D.   On: 05/03/2023 14:29   CT HEAD CODE STROKE WO CONTRAST  Result Date: 05/03/2023 CLINICAL DATA:  Code stroke.  Left-sided weakness EXAM: CT HEAD WITHOUT CONTRAST TECHNIQUE: Contiguous axial images were obtained from the base of the skull through the vertex without intravenous contrast. RADIATION DOSE REDUCTION: This exam was performed according to the departmental dose-optimization program which includes automated exposure control, adjustment of the mA and/or kV according to patient size and/or use of iterative reconstruction technique. COMPARISON:  Brain MR 05/11/16 FINDINGS: Brain: No hemorrhage. No hydrocephalus. No extra-axial fluid collection. There is an age indeterminate, but likely a chronic infarct in the left parietal lobe. Chronic left cerebellar infarct. No mass effect. No mass lesion. Mineralization of the basal ganglia bilaterally. Vascular: No hyperdense vessel or unexpected  calcification. Skull: Normal. Negative for fracture or focal lesion. Dermal calcifications are present. Sinuses/Orbits: Middle ear or mastoid. Paranasal sinuses are clear. Orbits are unremarkable. Other: None. ASPECTS [facility] Stroke Program Early CT Score): 10. Given patient's symptoms, the age indeterminate infarct in the left parietal lobe is likely chronic. IMPRESSION: 1.  No hemorrhage or CT evidence of a right sided cortical infarct. 2. 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 Findings were paged to Dr. Derry Lory on 05/03/23 at 1:55 PM via [facility] paging system. Electronically Signed   By: Lorenza Cambridge M.D.   On: 05/03/2023 13:55    Vitals:   05/03/23 2037 05/03/23 2334 05/04/23 0409 05/04/23 1028  BP: 124/68 125/61 (!) 119/55 112/68  Pulse: 98 (!) 101 (!) 103 (!) 102  Resp: 18 18 18 17   Temp: 98.8 F (37.1 C) 99 F (37.2 C) 98.6 F (37 C) 98.1 F (36.7 C)  TempSrc: Oral Oral Oral Oral  SpO2: 97% 98% 98% 99%  Weight:      Height:         PHYSICAL EXAM General:  Alert, well-nourished, well-developed patient in no acute distress Psych:  Mood and affect appropriate for situation CV: Regular rate and rhythm on monitor Respiratory:  Regular, unlabored respirations on room air GI: Abdomen soft and nontender   NEURO:  Mental Status: AA&Ox3, patient is able to give clear and coherent history Speech/Language: speech is without dysarthria or aphasia.  Naming, repetition, fluency, and comprehension intact.  Cranial Nerves:  II: PERRL. Visual fields full.  III, IV, VI: EOMI. Eyelids elevate symmetrically.  V: Sensation  is intact to light touch and symmetrical to face.  VII: Face is symmetrical resting and smiling VIII: hearing intact to voice. IX, X: Palate elevates symmetrically. Phonation is normal.  [id number] shrug 5/5. XII: tongue is midline without fasciculations. Motor: 5/5 strength to all muscle groups tested.  Tone: is normal and bulk is  normal Sensation- Decreased to left hand and wrist area.  Extinction absent to light touch to DSS.   Coordination: FTN intact bilaterally, HKS: no ataxia in BLE.No drift.  Gait- deferred  ASSESSMENT/PLAN  Acute Ischemic Infarct:  right thalamus  Etiology:  pending full stroke workup   Code Stroke CT head:  No acute abnormality.  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  No acute hemorrhage or significant mass effect.  2D Echo: 60-65%. No PFO. LDL 50 HgbA1c 5.5 VTE prophylaxis - lovenox Aspirin 325mg  prior to admission, now on aspirin 81 mg daily and clopidogrel 75 mg daily for 3 weeks and then plavix alone. Therapy recommendations:  Outpatient PT/OT/ST Disposition:  pending  Hypertension Home meds:  amlodipine 5mg  Stable Blood Pressure Goal: BP less than 220/110 --> return to normotensive gradually over 24 hours.   Hyperlipidemia Home meds:  none LDL 50, goal < 70 High intensity statin not indicated due to LDL within goal   Diabetes type II, no history Home meds:  none HgbA1c 5.5, goal < 7.0 CBGs SSI  Tobacco Abuse Former smoker  Other Stroke Risk Factors Advanced Age  Hospital day # 0   Pt seen by Neuro NP/APP and later by MD. Note/plan to be edited by MD as needed.    Lynnae January, DNP, AGACNP-BC [facility] Please use AMION for contact information & EPIC for messaging.  ATTENDING ATTESTATION:  82 year old right thalamic stroke on MRI.  She has some right hand numbness otherwise nonfocal exam.  DAPT therapy as above.  Continue PT OT therapy and poststroke protocol.  Echo is neg,  neurology will sign off.  Follow-up in stroke clinic in 8 weeks and GNA with Dr. Pearlean Brownie   Dr. Viviann Spare evaluated pt independently, reviewed imaging, chart, labs. Discussed and formulated plan with the Resident/APP. Changes were made to the note where  appropriate. Please see APP/resident note above for details.   Total 36 minutes spent on counseling patient and coordinating care, writing notes and reviewing chart.   Sheila Mcmenamin,MD   To contact Stroke Continuity provider, please refer to [email or web address]. After hours, contact General Neurology

## 2023-05-04 NOTE — Evaluation (Signed)
Occupational Therapy Evaluation Patient Details Name: Sheila Casey MRN: [id number] DOB: 11-15-1940 Today's Date: 05/04/2023   History of Present Illness Sheila Casey is a 82 yo female who presented with aphasia and left sided weakness and numbness on 05/03/2023. MRI demonstrates Acute perforator infarct in the right thalamus. PMHx: TIA, hypertension, lumbar laminectomy 2.5 weeks ago   Clinical Impression   Sheila Casey was evaluated s/p the above admission list. She is indep at baseline. Upon evaluation, pt demonstrated mod I ability to complete mobility and ADLs. Pt does report her gait feels a little off from baseline (rec PT), but no overt LOB or safety concern noted. Reviewed BE FAST with pt, she was very receptive. Pt does not require further acute, or follow up OT services. Recommend discharge back to pt's environment with assist as needed. OT to sign off with appreciation of order, please re-consult if needed.         If plan is discharge home, recommend the following: Assist for transportation    Functional Status Assessment  Patient has had a recent decline in their functional status and demonstrates the ability to make significant improvements in function in a reasonable and predictable amount of time.  Equipment Recommendations  None recommended by OT       Precautions / Restrictions Precautions Precautions: Fall Restrictions Weight Bearing Restrictions: No      Mobility Bed Mobility Overal bed mobility: Independent          Transfers Overall transfer level: Modified independent           General transfer comment: generalized supervision for gait, no AD      Balance Overall balance assessment: Needs assistance Sitting-balance support: Feet supported Sitting balance-Leahy Scale: Good     Standing balance support: No upper extremity supported, During functional activity Standing balance-Leahy Scale: Good             ADL either performed or assessed with  clinical judgement   ADL Overall ADL's : At baseline;Modified independent       General ADL Comments: no physical assist needed, mild gailt impairment noted (pt also reports gait it different from baseline). No AD or DME needed.     Vision Baseline Vision/History: 0 No visual deficits Vision Assessment?: No apparent visual deficits     Perception Perception: Within Functional Limits       Praxis Praxis: WFL       Pertinent Vitals/Pain Pain Assessment Pain Assessment: No/denies pain     Extremity/Trunk Assessment Upper Extremity Assessment Upper Extremity Assessment: LUE deficits/detail LUE Deficits / Details: ROM, doordination and MMT are [facility]. sensation is mildly impaired however, pt is able to accurately identify light touch location with eyes closed LUE Sensation: decreased light touch LUE Coordination: WNL   Lower Extremity Assessment Lower Extremity Assessment: Defer to PT evaluation   Cervical / Trunk Assessment Cervical / Trunk Assessment: Normal   Communication Communication Communication: No apparent difficulties   Cognition Arousal: Alert Behavior During Therapy: WFL for tasks assessed/performed Overall Cognitive Status: Within Functional Limits for tasks assessed           General Comments: reviewed BE FAST     General Comments  VSS, husband present            Home Living Family/patient expects to be discharged to:: Private residence Living Arrangements: Spouse/significant other Available Help at Discharge: Family Type of Home: House Home Access: Stairs to enter Secretary/administrator of Steps: 2 Entrance Stairs-Rails: None Home Layout: One  level     Bathroom Shower/Tub: Chief Strategy Officer: Handicapped height     Home Equipment: Shower seat;Grab bars - toilet;Cane - single point      Lives With: Spouse    Prior Functioning/Environment Prior Level of Function : Independent/Modified Independent              Mobility Comments: indep, no AD. cleared for full activity since recent back sx, lifting restriction of <15lbs ADLs Comments: indep        OT Problem List: Impaired sensation         OT Goals(Current goals can be found in the care plan section) Acute Rehab OT Goals Patient Stated Goal: home OT Goal Formulation: With patient Time For Goal Achievement: 05/18/23 Potential to Achieve Goals: Good   AM-PAC OT "6 Clicks" Daily Activity     Outcome Measure Help from another person eating meals?: None Help from another person taking care of personal grooming?: None Help from another person toileting, which includes using toliet, bedpan, or urinal?: None Help from another person bathing (including washing, rinsing, drying)?: None Help from another person to put on and taking off regular upper body clothing?: None Help from another person to put on and taking off regular lower body clothing?: None 6 Click Score: 24   End of Session Nurse Communication: Mobility status  Activity Tolerance: Patient tolerated treatment well Patient left: in bed;with call bell/phone within reach  OT Visit Diagnosis: Hemiplegia and hemiparesis Hemiplegia - Right/Left: Left Hemiplegia - dominant/non-dominant: Non-Dominant Hemiplegia - caused by: Cerebral infarction                Time: 1135-1158 OT Time Calculation (min): 23 min Charges:  OT General Charges $OT Visit: 1 Visit OT Evaluation $OT Eval Moderate Complexity: 1 Mod OT Treatments $Self Care/Home Management : 8-22 mins  Derenda Mis, OTR/L Acute Rehabilitation Services Office [phone] Secure Chat Communication Preferred   Donia Pounds 05/04/2023, 12:44 PM

## 2023-05-04 NOTE — Progress Notes (Signed)
*  PRELIMINARY RESULTS* Echocardiogram 2D Echocardiogram has been performed.  Laddie Aquas 05/04/2023, 10:32 AM

## 2023-05-04 NOTE — Evaluation (Signed)
Physical Therapy Evaluation  Patient Details Name: Sheila Casey MRN: [id number] DOB: 01-10-1941 Today's Date: 05/04/2023  History of Present Illness  Sheila Casey is a 82 yo female who presented with aphasia and left sided weakness and numbness on 05/03/2023. MRI demonstrates Acute perforator infarct in the right thalamus. PMHx: TIA, hypertension, lumbar laminectomy 2.5 weeks ago   Clinical Impression  Pt admitted with above diagnosis. Pt currently with functional limitations due to the deficits listed below (see PT Problem List). At the time of PT eval pt was able to perform transfers and ambulation with gross CGA and no AD. Of note she scored 13/24 on the DGI, indicating she is at an increased risk for falling compared to other community dwelling adults. Recommend neuro outpatient PT follow up to maximize functional return and return to PLOF. Acutely, pt will benefit from acute skilled PT to increase their independence and safety with mobility to allow discharge.           If plan is discharge home, recommend the following: A little help with walking and/or transfers;A little help with bathing/dressing/bathroom;Assistance with cooking/housework;Assist for transportation;Help with stairs or ramp for entrance   Can travel by private vehicle        Equipment Recommendations None recommended by PT  Recommendations for Other Services       Functional Status Assessment Patient has had a recent decline in their functional status and demonstrates the ability to make significant improvements in function in a reasonable and predictable amount of time.     Precautions / Restrictions Precautions Precautions: Fall Restrictions Weight Bearing Restrictions: No      Mobility  Bed Mobility               General bed mobility comments: Pt was received sitting up EOB    Transfers Overall transfer level: Needs assistance   Transfers: Sit to/from Stand Sit to Stand: Supervision            General transfer comment: Pt demonstrated proper hand placement on seated surface for safety. No overt LOB noted.    Ambulation/Gait Ambulation/Gait assistance: Contact guard assist Gait Distance (Feet): 400 Feet Assistive device: None Gait Pattern/deviations: Step-through pattern, Decreased stride length, Trunk flexed, Narrow base of support, Ataxic Gait velocity: Decreased Gait velocity interpretation: <1.31 ft/sec, indicative of household ambulator   General Gait Details: Ataxic with hard heel strike and ER noted in BLE's. As pt fatigued, she had moments of increased ataxia and COM outside BOS, requiring her to stop and "reset" before she started walking again.  Stairs Stairs: Yes Stairs assistance: Contact guard assist Stair Management: Two rails, Alternating pattern, Forwards Number of Stairs: 5 General stair comments: Practice stairs in rehab gym as part of the DGI. Pt with poor control on descent when leading with the LLE. Hands on guarding provided throughout.  Wheelchair Mobility     Tilt Bed    Modified Rankin (Stroke Patients Only) Modified Rankin (Stroke Patients Only) Pre-Morbid Rankin Score: No significant disability Modified Rankin: Moderately severe disability     Balance Overall balance assessment: Needs assistance Sitting-balance support: Feet supported, No upper extremity supported Sitting balance-Leahy Scale: Good     Standing balance support: No upper extremity supported, During functional activity Standing balance-Leahy Scale: Fair                   Standardized Balance Assessment Standardized Balance Assessment : Dynamic Gait Index   Dynamic Gait Index Level Surface: Mild Impairment Change in  Gait Speed: Mild Impairment Gait with Horizontal Head Turns: Moderate Impairment Gait with Vertical Head Turns: Moderate Impairment Gait and Pivot Turn: Mild Impairment Step Over Obstacle: Moderate Impairment Step Around Obstacles: Mild  Impairment Steps: Mild Impairment Total Score: 13       Pertinent Vitals/Pain Pain Assessment Pain Assessment: No/denies pain    Home Living Family/patient expects to be discharged to:: Private residence Living Arrangements: Spouse/significant other Available Help at Discharge: Family Type of Home: House Home Access: Stairs to enter Entrance Stairs-Rails: None Secretary/administrator of Steps: 2   Home Layout: One level Home Equipment: Shower seat;Grab bars - toilet;Cane - single point      Prior Function Prior Level of Function : Independent/Modified Independent             Mobility Comments: indep, no AD. cleared for full activity since recent back sx, lifting restriction of <15lbs ADLs Comments: indep     Extremity/Trunk Assessment   Upper Extremity Assessment Upper Extremity Assessment: Defer to OT evaluation LUE Deficits / Details: ROM, doordination and MMT are [facility]. sensation is mildly impaired however, pt is able to accurately identify light touch location with eyes closed LUE Sensation: decreased light touch LUE Coordination: WNL    Lower Extremity Assessment Lower Extremity Assessment: LLE deficits/detail LLE Deficits / Details: Decreased light touch to lateral thigh, and full lower leg/foot. Decreased coordination/ataxia and mild weakness. LLE Sensation: decreased light touch LLE Coordination: decreased gross motor    Cervical / Trunk Assessment Cervical / Trunk Assessment: Back Surgery (Recently)  Communication   Communication Communication: No apparent difficulties Cueing Techniques: Verbal cues;Gestural cues  Cognition Arousal: Alert Behavior During Therapy: WFL for tasks assessed/performed Overall Cognitive Status: Within Functional Limits for tasks assessed                                          General Comments General comments (skin integrity, edema, etc.): VSS, husband present    Exercises     Assessment/Plan     PT Assessment Patient needs continued PT services  PT Problem List Decreased strength;Decreased activity tolerance;Decreased balance;Decreased mobility;Decreased coordination;Decreased knowledge of use of DME;Decreased safety awareness;Decreased knowledge of precautions;Impaired sensation       PT Treatment Interventions DME instruction;Gait training;Stair training;Functional mobility training;Therapeutic activities;Therapeutic exercise;Balance training;Neuromuscular re-education;Patient/family education    PT Goals (Current goals can be found in the Care Plan section)  Acute Rehab PT Goals Patient Stated Goal: Home today, get back to normal PT Goal Formulation: With patient/family Time For Goal Achievement: 05/11/23 Potential to Achieve Goals: Good Additional Goals Additional Goal #1: Pt will score >19/24 on the DGI to indicate a decreased risk for falls.    Frequency Min 1X/week     Co-evaluation               AM-PAC PT "6 Clicks" Mobility  Outcome Measure Help needed turning from your back to your side while in a flat bed without using bedrails?: None Help needed moving from lying on your back to sitting on the side of a flat bed without using bedrails?: None Help needed moving to and from a bed to a chair (including a wheelchair)?: A Little Help needed standing up from a chair using your arms (e.g., wheelchair or bedside chair)?: A Little Help needed to walk in hospital room?: A Little Help needed climbing 3-5 steps with a railing? : A Little 6  Click Score: 20    End of Session Equipment Utilized During Treatment: Gait belt Activity Tolerance: Patient tolerated treatment well Patient left: in chair;with call bell/phone within reach;with family/visitor present Nurse Communication: Mobility status PT Visit Diagnosis: Unsteadiness on feet (R26.81);Hemiplegia and hemiparesis Hemiplegia - Right/Left: Left Hemiplegia - dominant/non-dominant: Non-dominant Hemiplegia -  caused by: Cerebral infarction    Time: 1157-1217 PT Time Calculation (min) (ACUTE ONLY): 20 min   Charges:   PT Evaluation $PT Eval Low Complexity: 1 Low   PT General Charges $$ ACUTE PT VISIT: 1 Visit         Conni Slipper, PT, DPT Acute Rehabilitation Services Secure Chat Preferred Office: [phone]   Marylynn Pearson 05/04/2023, 12:57 PM

## 2023-05-04 NOTE — Care Management Obs Status (Signed)
MEDICARE OBSERVATION STATUS NOTIFICATION   Patient Details  Name: Sheila Casey MRN: [id number] Date of Birth: 07-05-1941   Medicare Observation Status Notification Given:  Yes    Kermit Balo, RN 05/04/2023, 2:22 PM

## 2023-05-04 NOTE — Discharge Summary (Signed)
Physician Discharge Summary  Sheila Casey UJW:[id number] DOB: [birth year] DOA: 05/03/2023  PCP: Creola Corn, MD  Admit date: 05/03/2023 Discharge date: 05/04/2023  Time spent: 40 minutes  Recommendations for Outpatient Follow-up:  Follow outpatient CBC/CMP  Follow with neurology outpatient   Discharge Diagnoses:  Principal Problem:   Stroke-like symptom   Discharge Condition: stable  Diet recommendation: heart healthy  Filed Weights   05/03/23 1300  Weight: 66.6 kg    History of present illness:   Sheila Casey is Sheila Casey 82 y.o. female with Past medical history of TIA, hypertension, lumbar laminectomy 2 and half weeks ago as reviewed from EMR, presented at [facility], ED with complaining of left-sided weakness and numbness started last night around 11 PM.   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  Seen by neurology, will d/c with DAPTx3 weeks, then plavix.  LDL 50, no statin per neurology.  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 Left ventricular ejection fraction, by estimation, is 60 to 65%. The  left ventricle has normal function. The left ventricle has no regional  wall motion abnormalities. Left ventricular diastolic parameters are  consistent with Grade I diastolic  dysfunction (impaired relaxation).   2. Right ventricular systolic function is normal. The right ventricular  size is normal.   3. The mitral valve is normal in  structure. Mild to moderate mitral valve  regurgitation. No evidence of mitral stenosis.   4. The aortic valve is normal in structure. Aortic valve regurgitation is  trivial. Aortic valve sclerosis is present, with no evidence of aortic  valve stenosis. Aortic regurgitation PHT measures 408 msec.   5. The inferior vena cava is normal in size with greater than 50%  respiratory variability, suggesting right atrial pressure of 3 mmHg.   Consultations: neurology  Discharge Exam: Vitals:   05/04/23 0409 05/04/23 1028  BP: (!) 119/55 112/68  Pulse: (!) 103 (!) 102  Resp: 18 17  Temp: 98.6 F (37 C) 98.1 F (36.7 C)  SpO2: 98% 99%   No complaints Husband at bedside  General: No acute distress. Cardiovascular: Heart sounds show Sheila Casey regular rate, and rhythm. No gallops or rubs. No murmurs. No JVD. Lungs: unlabored Neurological: Alert and oriented 3. Moves all extremities 4 with equal strength. Mild sensory deficit on L. Extremities: No clubbing or cyanosis. No edema.   Discharge Instructions   Discharge Instructions     Ambulatory referral to Neurology   Complete by: As directed    An appointment is requested in approximately: 4 weeks   Ambulatory referral to Physical Therapy   Complete by: As directed    Call MD for:  difficulty breathing, headache or visual disturbances   Complete by: As directed    Call MD for:  extreme fatigue   Complete by: As directed    Call MD for:  hives   Complete by: As directed    Call MD for:  persistant  dizziness or light-headedness   Complete by: As directed    Call MD for:  persistant nausea and vomiting   Complete by: As directed    Call MD for:  redness, tenderness, or signs of infection (pain, swelling, redness, odor or green/yellow discharge around incision site)   Complete by: As directed    Call MD for:  severe uncontrolled pain   Complete by: As directed    Call MD for:  temperature >100.4   Complete by: As directed    Diet - low  sodium heart healthy   Complete by: As directed    Discharge instructions   Complete by: As directed    You were seen for Sheila Casey stroke.  We're transitioning you to aspirin and plavix for 21 days, followed by plavix alone.  Continue your repatha.    We'll arrange home health therapy.  You should follow with neurology outpatient.    Return for new, recurrent, or worsening symptoms.  Please ask your PCP to request records from this hospitalization so they know what was done and what the next steps will be.   Increase activity slowly   Complete by: As directed       Allergies as of 05/04/2023       Reactions   Atorvastatin Other (See Comments)   myalgias   Influenza Vaccines Other (See Comments)   Sulfonamide Derivatives Anxiety   hyper active        Medication List     STOP taking these medications    ibuprofen 200 MG tablet Commonly known as: ADVIL       TAKE these medications    acetaminophen 650 MG CR tablet Commonly known as: TYLENOL Take 650-1,300 mg by mouth 2 (two) times daily as needed for pain.   amLODipine 5 MG tablet Commonly known as: NORVASC Take 5 mg by mouth in the morning.   aspirin EC 81 MG tablet Take 1 tablet (81 mg total) by mouth daily for 21 days. Take for 21 days with plavix. Start taking on: May 05, 2023 What changed:  medication strength how much to take when to take this additional instructions   CAL-MAG-ZINC PO Take 1 tablet by mouth at bedtime.   clopidogrel 75 MG tablet Commonly known as: PLAVIX Take 1 tablet (75 mg total) by mouth daily. Follow up with neurology or your PCP for refills Start taking on: May 05, 2023   Co Q 10 100 MG Caps Take 1 capsule by mouth daily.   diphenhydramine-acetaminophen 25-500 MG Tabs tablet Commonly known as: TYLENOL PM Take 1-2 tablets by mouth at bedtime as needed (pain).   MAGNESIUM PO Take 1 capsule by mouth at bedtime.   Repatha SureClick 140 MG/ML Soaj Generic drug:  Evolocumab Inject 140 mg into the skin See admin instructions. Inject 140mg  into the skin every other Thursday   tiZANidine 2 MG tablet Commonly known as: ZANAFLEX Take 2 mg by mouth every 8 (eight) hours as needed for muscle spasms.   Vitamin D (Cholecalciferol) 25 MCG (1000 UT) Caps Take 1 capsule by mouth daily.       Allergies  Allergen Reactions   Atorvastatin Other (See Comments)    myalgias   Influenza Vaccines Other (See Comments)   Sulfonamide Derivatives Anxiety    hyper active    Follow-up Information     [facility] Follow up.   Why: The outpt rehab will contact you for the first appointment Contact information: [address],  [place], [place] [address]  Phone: [phone]                 The results of significant diagnostics from this hospitalization (including imaging, microbiology, ancillary and laboratory) are listed below for reference.    Significant Diagnostic Studies: ECHOCARDIOGRAM COMPLETE  Result Date: 05/04/2023    ECHOCARDIOGRAM REPORT   Patient Name:   Sheila Casey Date of Exam: 05/04/2023 Medical Rec #:  [id number]      Height:       63.0 in Accession #:    [id number]     Weight:       146.8 lb Date of Birth:  1941-04-01       BSA:          1.696 m Patient Age:    82 years       BP:           119/55 mmHg Patient Gender: F              HR:           100 bpm. Exam Location:  Inpatient Procedure: 2D Echo, Cardiac Doppler and Color Doppler Indications:    Stroke I63.9  History:        Patient has prior history of Echocardiogram examinations, most                 recent 05/12/2016. Stroke and TIA; Risk Factors:Hypertension.  Sonographer:    Dondra Prader RVT RCS Referring Phys: Gillis Santa IMPRESSIONS  1. Left ventricular ejection fraction, by estimation, is 60 to 65%. The left ventricle has normal function. The left ventricle has no regional wall motion abnormalities. Left ventricular diastolic parameters are consistent with Grade I  diastolic dysfunction (impaired relaxation).  2. Right ventricular systolic function is normal. The right ventricular size is normal.  3. The mitral valve is normal in structure. Mild to moderate mitral valve regurgitation. No evidence of mitral stenosis.  4. The aortic valve is normal in structure. Aortic valve regurgitation is trivial. Aortic valve sclerosis is present, with no evidence of aortic valve stenosis. Aortic regurgitation PHT measures 408 msec.  5. The inferior vena cava is normal in size with greater than 50% respiratory variability, suggesting right atrial pressure of 3 mmHg. FINDINGS  Left Ventricle: Left ventricular ejection fraction, by estimation, is 60 to 65%. The left ventricle has normal function. The left ventricle has no regional wall motion abnormalities. The left ventricular internal cavity size was normal in size. There is  no left ventricular hypertrophy. Left ventricular diastolic parameters are consistent with Grade I diastolic dysfunction (impaired relaxation). Right Ventricle: The right ventricular size is normal. No increase in right ventricular wall thickness. Right ventricular systolic function is normal. Left [facility]: Left atrial size was normal in size. Right [facility]: Right atrial size was normal in size. Pericardium: There is no evidence of pericardial effusion. Mitral Valve: The mitral valve is normal in structure. Mild to moderate mitral valve regurgitation. No evidence of mitral valve stenosis. Tricuspid Valve: The tricuspid valve is normal in structure. Tricuspid valve regurgitation is trivial. No evidence of tricuspid stenosis. Aortic Valve: The aortic valve is normal in structure. Aortic valve regurgitation is trivial. Aortic regurgitation PHT measures 408 msec. Aortic valve sclerosis is present, with no evidence of aortic valve stenosis. Aortic valve mean gradient measures 2.7 mmHg. Aortic valve peak gradient measures 4.8 mmHg. Aortic valve area, by VTI measures 2.44 cm.  Pulmonic Valve: The pulmonic valve was normal in  structure. Pulmonic valve regurgitation is not visualized. No evidence of pulmonic stenosis. Aorta: The aortic root is normal in size and structure. Venous: The inferior vena cava is normal in size with greater than 50% respiratory variability, suggesting right atrial pressure of 3 mmHg. IAS/Shunts: No atrial level shunt detected by color flow Doppler.  LEFT VENTRICLE PLAX 2D LVIDd:         4.00 cm   Diastology LVIDs:         2.40 cm   LV e' medial:    7.21 cm/s LV PW:         0.90 cm   LV E/e' medial:  10.7 LV IVS:        0.90 cm   LV e' lateral:   9.82 cm/s LVOT diam:     1.80 cm   LV E/e' lateral: 7.9 LV SV:         41 LV SV Index:   24 LVOT Area:     2.54 cm  IVC IVC diam: 1.50 cm LEFT ATRIUM             Index LA diam:        3.20 cm 1.89 cm/m LA Vol (A2C):   44.9 ml 26.48 ml/m LA Vol (A4C):   28.7 ml 16.93 ml/m LA Biplane Vol: 39.9 ml 23.53 ml/m  AORTIC VALVE                    PULMONIC VALVE AV Area (Vmax):    2.32 cm     PV Vmax:       1.16 m/s AV Area (Vmean):   2.24 cm     PV Peak grad:  5.4 mmHg AV Area (VTI):     2.44 cm AV Vmax:           109.67 cm/s AV Vmean:          75.933 cm/s AV VTI:            0.167 m AV Peak Grad:      4.8 mmHg AV Mean Grad:      2.7 mmHg LVOT Vmax:         100.12 cm/s LVOT Vmean:        66.775 cm/s LVOT VTI:          0.161 m LVOT/AV VTI ratio: 0.96 AI PHT:            408 msec  AORTA Ao Root diam: 2.60 cm Ao Asc diam:  3.70 cm MITRAL VALVE                TRICUSPID VALVE MV Area (PHT): 4.68 cm     TR Peak grad:   21.5 mmHg MV Decel Time: 162 msec     TR Vmax:        232.00 cm/s MV E velocity: 77.10 cm/s MV Johncharles Fusselman velocity: 121.00 cm/s  SHUNTS MV E/Olisa Quesnel ratio:  0.64         Systemic VTI:  0.16 m                             Systemic Diam: 1.80 cm Kardie Tobb DO Electronically signed by Thomasene Ripple DO Signature Date/Time: 05/04/2023/4:01:26 PM    Final    MR BRAIN WO CONTRAST  Result Date: 05/03/2023 CLINICAL DATA:  Neuro deficit,  acute, stroke suspected. Left-sided weakness. EXAM: MRI HEAD WITHOUT CONTRAST TECHNIQUE: Multiplanar, multiecho pulse sequences of  the brain and surrounding structures were obtained without intravenous contrast. COMPARISON:  CTA head/neck 05/03/2023. 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 No acute hemorrhage or significant mass effect. Encephalomalacia from prior infarct along the left supramarginal gyrus. 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 No hydrocephalus or extra-axial collection. No foci of abnormal susceptibility. Vascular: Normal flow voids. Skull and upper cervical spine: Normal marrow signal. Sinuses/Orbits: No acute findings. Other: None. 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 No acute hemorrhage or significant mass effect. Electronically Signed   By: Orvan Falconer M.D.   On: 05/03/2023 19:50   CT ANGIO HEAD NECK W WO CM (CODE STROKE)  Result Date: 05/03/2023 CLINICAL DATA:  Neuro deficit, acute, stroke suspected EXAM: CT ANGIOGRAPHY HEAD AND NECK WITH AND WITHOUT CONTRAST TECHNIQUE: Multidetector CT imaging of the head and neck was performed using the standard protocol during bolus administration of intravenous contrast. Multiplanar CT image reconstructions and MIPs were obtained to evaluate the vascular anatomy. Carotid stenosis measurements (when applicable) are obtained utilizing NASCET criteria, using the distal internal carotid diameter as the denominator. RADIATION DOSE REDUCTION: This exam was performed according to the departmental dose-optimization program which includes automated exposure control, adjustment of the mA and/or kV according to patient size and/or use of iterative reconstruction technique. CONTRAST:  75mL OMNIPAQUE IOHEXOL 350 MG/ML SOLN COMPARISON:  None Available. FINDINGS: CT HEAD FINDINGS See same day CT head for intracranial findings CTA NECK FINDINGS  Aortic arch: Standard branching. Imaged portion shows no evidence of aneurysm or dissection. No significant stenosis of the major arch vessel origins. Right carotid system: No evidence of dissection, stenosis (50% or greater), or occlusion. Left carotid system: No evidence of dissection, stenosis (50% or greater), or occlusion. There is mild focal narrowing in the proximal left ICA secondary to soft atherosclerotic plaque. Vertebral arteries: Left dominant. No evidence of dissection, stenosis (50% or greater), or occlusion. Skeleton: Negative. Grade 1 anterolisthesis of C4 on C5 and trace retrolisthesis of C5 on C6. Other neck: Negative. Upper chest: Negative. Review of the MIP images confirms the above findings CTA HEAD FINDINGS Anterior circulation: No significant stenosis, proximal occlusion, aneurysm, or vascular malformation. Posterior circulation: No significant stenosis, proximal occlusion, aneurysm, or vascular malformation. Venous sinuses: As permitted by contrast timing, patent. Anatomic variants: None Review of the MIP images confirms the above findings IMPRESSION: 1. No intracranial large vessel occlusion or significant stenosis. 2. No hemodynamically significant stenosis in the neck. 3. See same day CT head for intracranial findings. Electronically Signed   By: Lorenza Cambridge M.D.   On: 05/03/2023 14:29   CT HEAD CODE STROKE WO CONTRAST  Result Date: 05/03/2023 CLINICAL DATA:  Code stroke.  Left-sided weakness EXAM: CT HEAD WITHOUT CONTRAST TECHNIQUE: Contiguous axial images were obtained from the base of the skull through the vertex without intravenous contrast. RADIATION DOSE REDUCTION: This exam was performed according to the departmental dose-optimization program which includes automated exposure control, adjustment of the mA and/or kV according to patient size and/or use of iterative reconstruction technique. COMPARISON:  Brain MR 05/11/16 FINDINGS: Brain: No hemorrhage. No hydrocephalus. No  extra-axial fluid collection. There is an age indeterminate, but likely Mayreli Alden chronic infarct in the left parietal lobe. Chronic left cerebellar infarct. No mass effect. No mass lesion. Mineralization of the basal ganglia bilaterally. Vascular: No hyperdense vessel or unexpected calcification. Skull: Normal. Negative for fracture or focal lesion. Dermal calcifications are present. Sinuses/Orbits: Middle ear  or mastoid. Paranasal sinuses are clear. Orbits are unremarkable. Other: None. ASPECTS [facility] Stroke Program Early CT Score): 10. Given patient's symptoms, the age indeterminate infarct in the left parietal lobe is likely chronic. IMPRESSION: 1.  No hemorrhage or CT evidence of Jontavia Leatherbury right sided cortical infarct. 2. 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 Findings were paged to Dr. Derry Lory on 05/03/23 at 1:55 PM via [facility] paging system. Electronically Signed   By: Lorenza Cambridge M.D.   On: 05/03/2023 13:55   DG Lumbar Spine 2-3 Views  Result Date: 04/16/2023 CLINICAL DATA:  Elective surgery EXAM: LUMBAR SPINE - 2-3 VIEW COMPARISON:  MRI 11/19/2022 FINDINGS: Intraoperative cross-table lateral view at 11:39 demonstrates linear localizing instrument overlying the spinous process of L5. Subsequent image 2 at 11:56 demonstrates surgical instruments posterior to L4-L5. IMPRESSION: Limited radiographs obtained for intraoperative localization Electronically Signed   By: Jasmine Pang M.D.   On: 04/16/2023 20:53    Microbiology: No results found for this or any previous visit (from the past 240 hour(s)).   Labs: Basic Metabolic Panel: Recent Labs  Lab 05/03/23 1340 05/03/23 1343 05/04/23 0547  NA 138 140 140  K 4.1 4.2 3.8  CL 105 107 109  CO2 22  --  23  GLUCOSE 104* 100* 103*  BUN 17 19 13   CREATININE 0.93 0.90 0.73  CALCIUM 9.3  --  9.1  MG  --   --  2.1  PHOS  --   --  3.4   Liver Function Tests: Recent Labs  Lab 05/03/23 1340  AST 16  ALT 16  ALKPHOS 69   BILITOT 0.4  PROT 6.8  ALBUMIN 3.9   No results for input(s): "LIPASE", "AMYLASE" in the last 168 hours. No results for input(s): "AMMONIA" in the last 168 hours. CBC: Recent Labs  Lab 05/03/23 1340 05/03/23 1343 05/04/23 0547  WBC 8.9  --  6.4  NEUTROABS 6.2  --   --   HGB 11.6* 12.2 10.5*  HCT 35.8* 36.0 32.0*  MCV 93.7  --  92.0  PLT 338  --  317   Cardiac Enzymes: No results for input(s): "CKTOTAL", "CKMB", "CKMBINDEX", "TROPONINI" in the last 168 hours. BNP: BNP (last 3 results) No results for input(s): "BNP" in the last 8760 hours.  ProBNP (last 3 results) No results for input(s): "PROBNP" in the last 8760 hours.  CBG: Recent Labs  Lab 05/03/23 1337  GLUCAP 105*       Signed:  Lacretia Nicks MD.  [facility] 05/04/2023, 4:20 PM

## 2023-08-16 ENCOUNTER — Ambulatory Visit: Payer: Medicare Other | Admitting: Neurology
